# Patient Record
Sex: Male | Born: 1965 | Race: White | Hispanic: No | Marital: Married | Smoking: Never smoker
Health system: Southern US, Community
[De-identification: ages and names within clinical notes are randomized; demographics above are authoritative.]

## PROBLEM LIST (undated history)

## (undated) DIAGNOSIS — K219 Gastro-esophageal reflux disease without esophagitis: Secondary | ICD-10-CM

## (undated) DIAGNOSIS — E119 Type 2 diabetes mellitus without complications: Secondary | ICD-10-CM

## (undated) HISTORY — PX: COLON SURGERY: SHX602

## (undated) HISTORY — PX: EYE SURGERY: SHX253

---

## 2005-02-04 ENCOUNTER — Inpatient Hospital Stay: Payer: Self-pay | Admitting: Surgery

## 2005-02-04 IMAGING — CT CT PELVIS W/O CM
1 series · 16 of 32 positions shown, 20 images · non-contrast
Comparison: none

REASON FOR EXAM: (1) foreign body in abd; rm4; (2) foreign body in abd :rm 4
COMMENTS:

[Series 2: abdomen · axial · 0.74mm/px · z∈[-676,-436]mm · 16 of 54 slices shown, 20 images]
[im 4/54  soft-tissue]
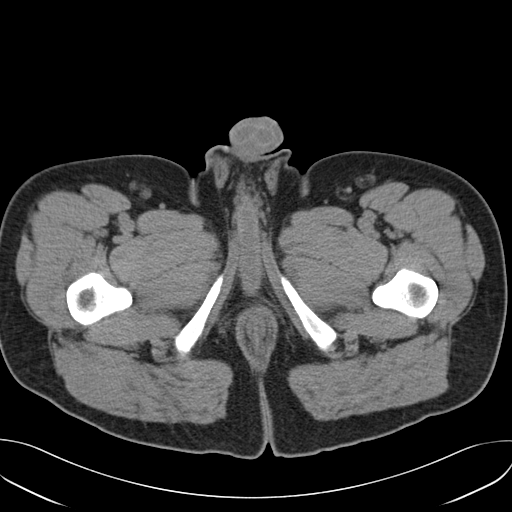
[im 4/54  bone]
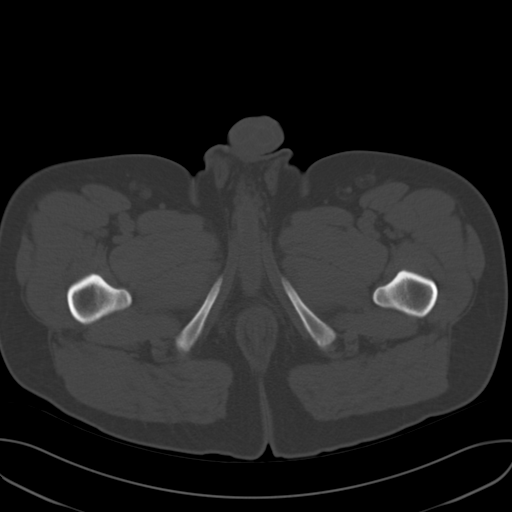
[im 7/54  soft-tissue]
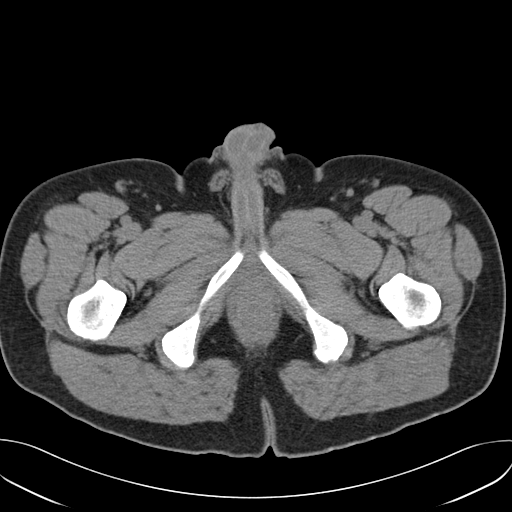
[im 11/54  soft-tissue]
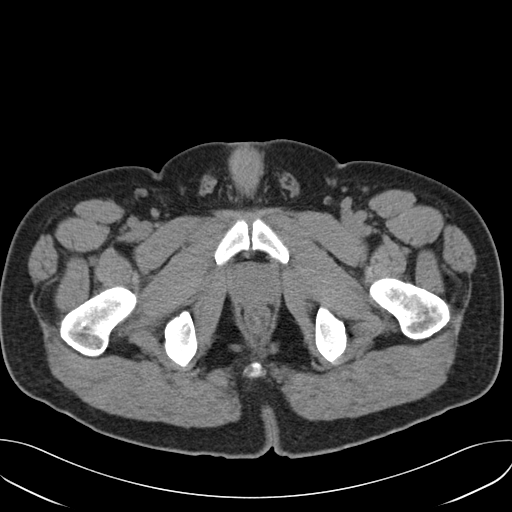
[im 14/54  soft-tissue]
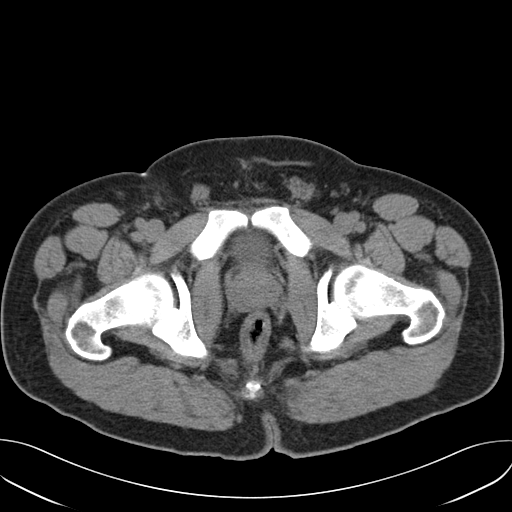
[im 18/54  soft-tissue]
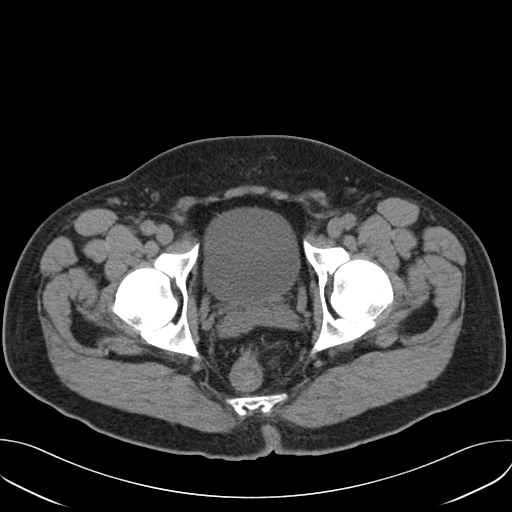
[im 21/54  soft-tissue]
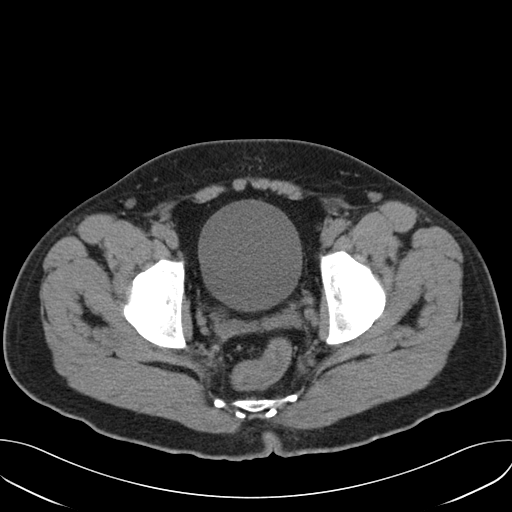
[im 24/54  soft-tissue]
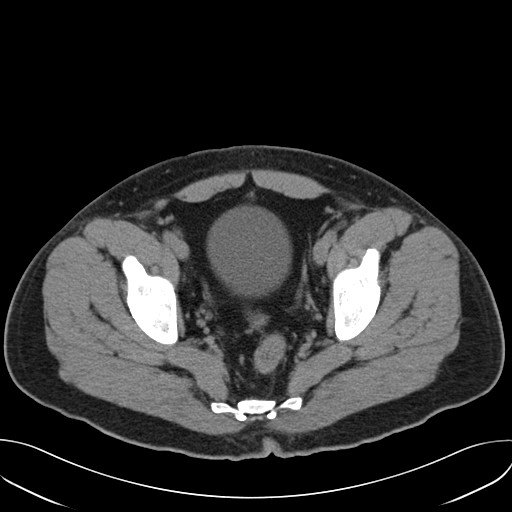
[im 30/54  soft-tissue]
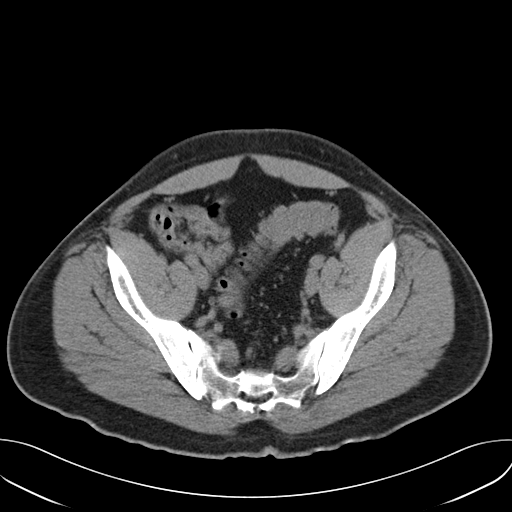
[im 33/54  soft-tissue]
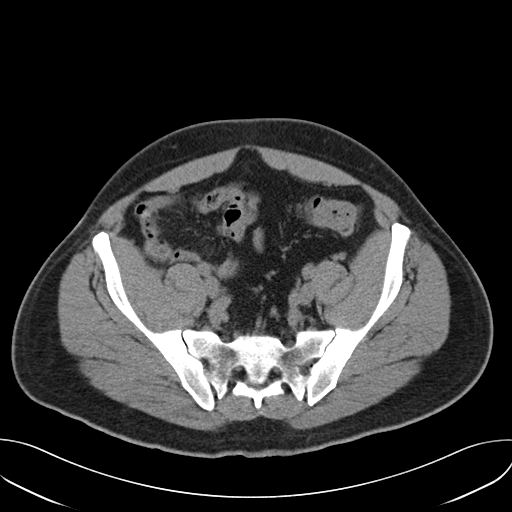
[im 33/54  bone]
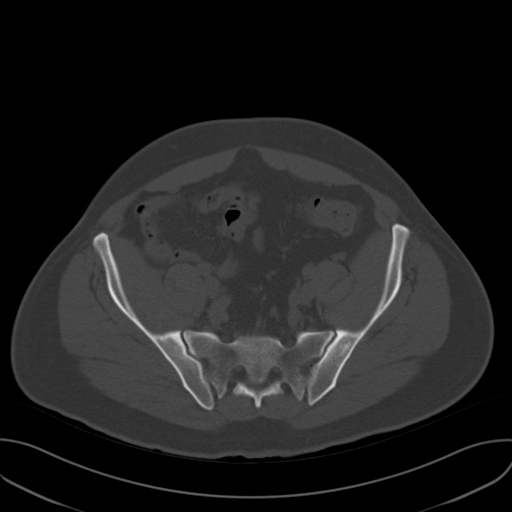
[im 36/54  soft-tissue]
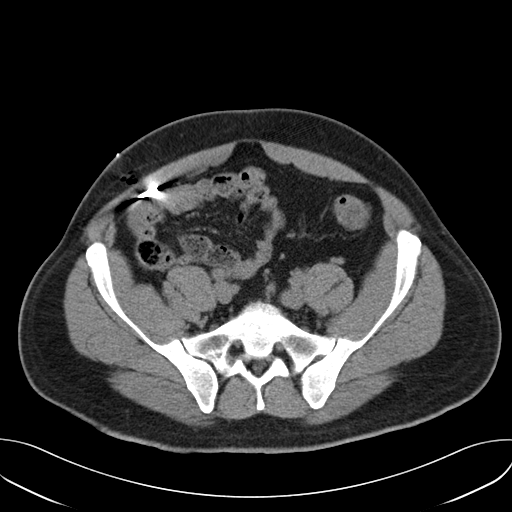
[im 40/54  soft-tissue]
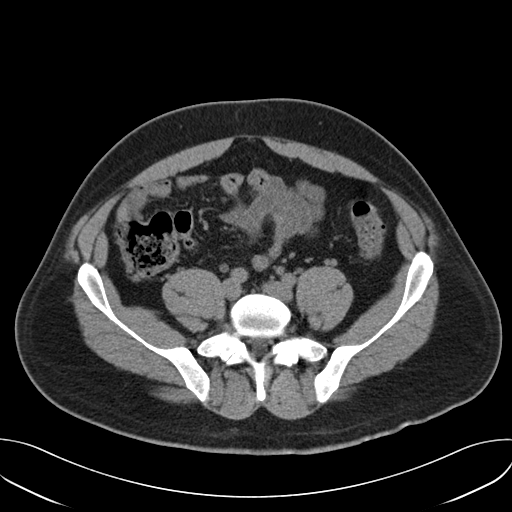
[im 43/54  soft-tissue]
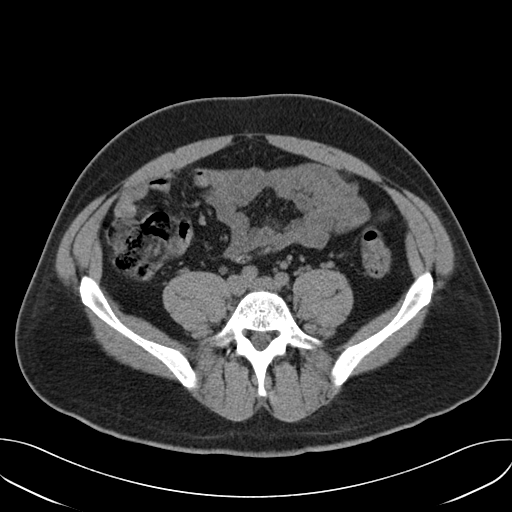
[im 47/54  soft-tissue]
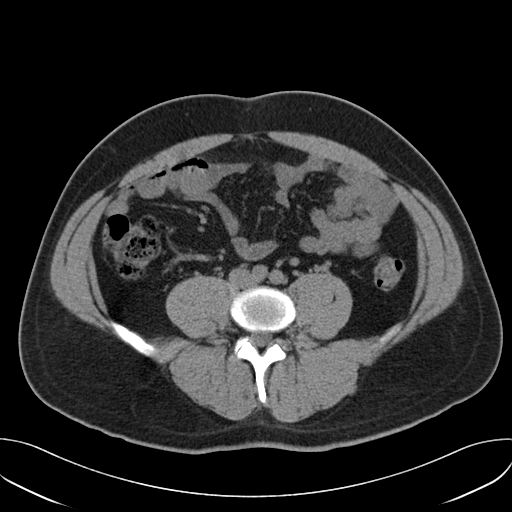
[im 47/54  lung]
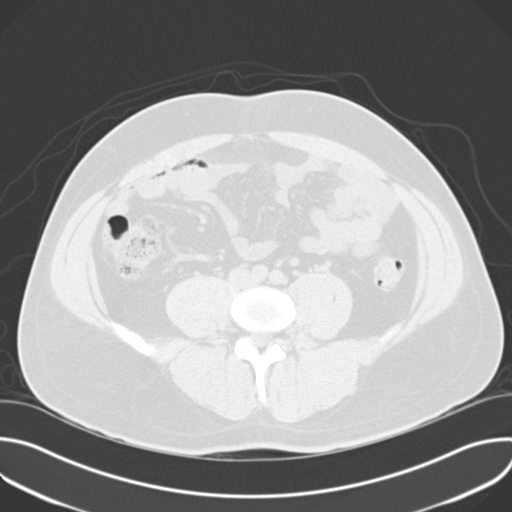
[im 48/54  lung]
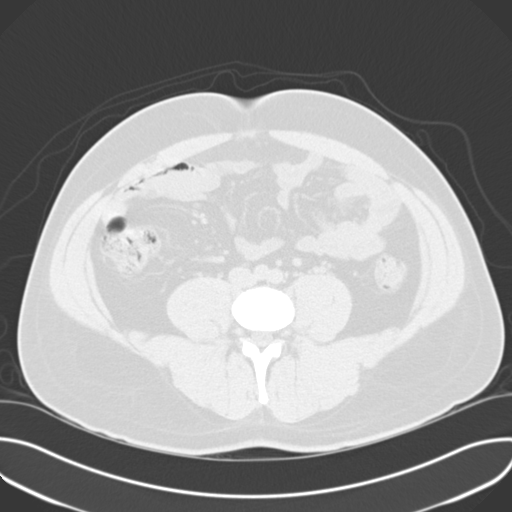
[im 50/54  soft-tissue]
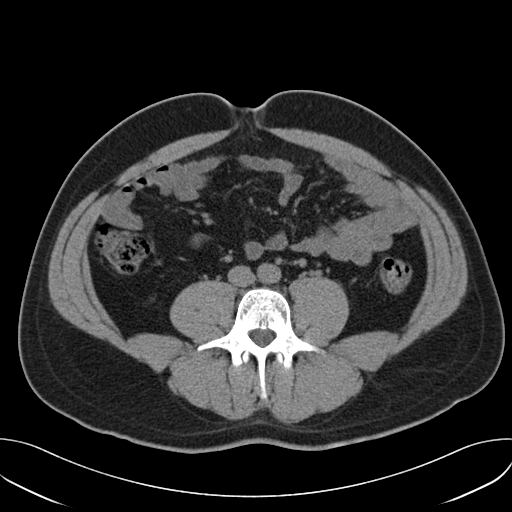
[im 50/54  lung]
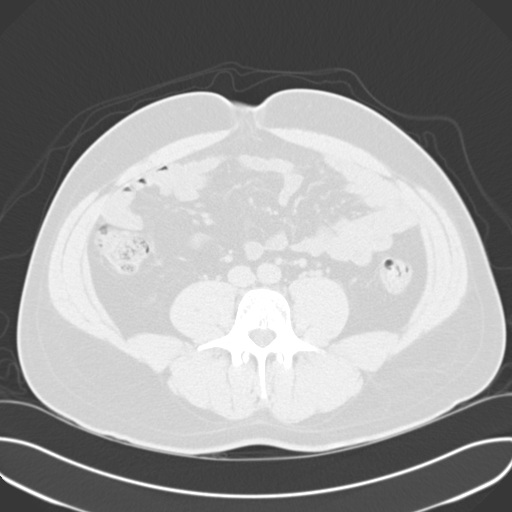
[im 52/54  lung]
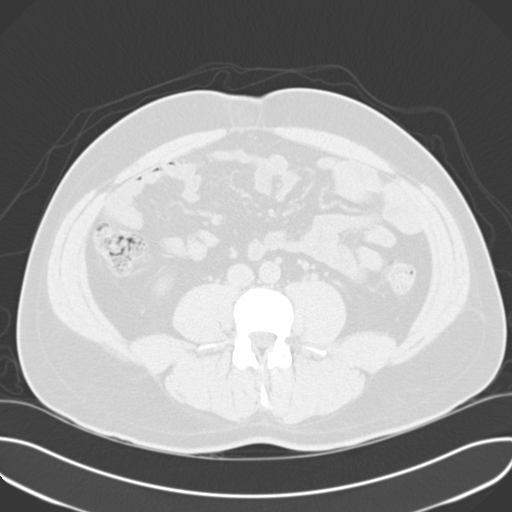

[16 of 32 positions shown; findings below may reference images not displayed]

PROCEDURE:     CT  - CT PELVIS STANDARD WO  - [DATE] [DATE]

RESULT:       Non-gadolinium CT imaging of the pelvis is performed.  The
examination is performed to evaluate for foreign body.  Images demonstrate a
linear density over the anterior RIGHT lower abdominal region superimposed
over the RIGHT iliac crest on the AP topographic view.  This has protruded
through the rectus abdominis muscle and is either adjacent to or extending
into a loop of bowel.    There does not appear to be evidence of free air or
free fluid.  No other significant abnormality is evident.

Findings were phoned to the [HOSPITAL] the time of dictation.
IMPRESSION: Foreign body over the RIGHT anterolateral lower abdominal region.

## 2006-03-13 ENCOUNTER — Ambulatory Visit: Payer: Self-pay | Admitting: Gastroenterology

## 2008-10-04 ENCOUNTER — Emergency Department: Payer: Self-pay | Admitting: Emergency Medicine

## 2008-10-21 ENCOUNTER — Ambulatory Visit: Payer: Self-pay | Admitting: Specialist

## 2008-10-22 ENCOUNTER — Inpatient Hospital Stay: Payer: Self-pay | Admitting: Specialist

## 2008-12-24 ENCOUNTER — Ambulatory Visit: Payer: Self-pay | Admitting: Specialist

## 2009-03-30 ENCOUNTER — Other Ambulatory Visit: Payer: Self-pay | Admitting: Specialist

## 2009-06-04 ENCOUNTER — Ambulatory Visit: Payer: Self-pay | Admitting: Specialist

## 2009-06-10 ENCOUNTER — Ambulatory Visit: Payer: Self-pay | Admitting: Specialist

## 2010-04-06 ENCOUNTER — Observation Stay: Payer: Self-pay | Admitting: Internal Medicine

## 2017-04-24 ENCOUNTER — Encounter: Payer: Self-pay | Admitting: Emergency Medicine

## 2017-04-24 ENCOUNTER — Emergency Department
Admission: EM | Admit: 2017-04-24 | Discharge: 2017-04-24 | Disposition: A | Discharge status: Home / Self Care | Payer: BC Managed Care – PPO | Attending: Emergency Medicine | Admitting: Emergency Medicine

## 2017-04-24 ENCOUNTER — Emergency Department: Payer: BC Managed Care – PPO

## 2017-04-24 ENCOUNTER — Other Ambulatory Visit: Payer: Self-pay

## 2017-04-24 DIAGNOSIS — R079 Chest pain, unspecified: Secondary | ICD-10-CM

## 2017-04-24 DIAGNOSIS — E119 Type 2 diabetes mellitus without complications: Secondary | ICD-10-CM | POA: Insufficient documentation

## 2017-04-24 DIAGNOSIS — R0789 Other chest pain: Secondary | ICD-10-CM | POA: Diagnosis present

## 2017-04-24 HISTORY — DX: Type 2 diabetes mellitus without complications: E11.9

## 2017-04-24 HISTORY — DX: Gastro-esophageal reflux disease without esophagitis: K21.9

## 2017-04-24 LAB — CBC
HEMATOCRIT: 48.1 % (ref 40.0–52.0)
Hemoglobin: 16.1 g/dL (ref 13.0–18.0)
MCH: 26.8 pg (ref 26.0–34.0)
MCHC: 33.5 g/dL (ref 32.0–36.0)
MCV: 79.9 fL — AB (ref 80.0–100.0)
Platelets: 183 10*3/uL (ref 150–440)
RBC: 6.02 MIL/uL — AB (ref 4.40–5.90)
RDW: 14.4 % (ref 11.5–14.5)
WBC: 4.9 10*3/uL (ref 3.8–10.6)

## 2017-04-24 LAB — BASIC METABOLIC PANEL
ANION GAP: 7 (ref 5–15)
BUN: 20 mg/dL (ref 6–20)
CHLORIDE: 102 mmol/L (ref 101–111)
CO2: 26 mmol/L (ref 22–32)
Calcium: 9.3 mg/dL (ref 8.9–10.3)
Creatinine, Ser: 1.16 mg/dL (ref 0.61–1.24)
Glucose, Bld: 152 mg/dL — ABNORMAL HIGH (ref 65–99)
POTASSIUM: 5 mmol/L (ref 3.5–5.1)
SODIUM: 135 mmol/L (ref 135–145)

## 2017-04-24 LAB — TROPONIN I: Troponin I: <0.03 ng/mL (ref <0.03)

## 2017-04-24 MED ORDER — ASPIRIN EC 325 MG PO TBEC
325.0000 mg | DELAYED_RELEASE_TABLET | Freq: Once | ORAL | Status: AC
  Administered 2017-04-24: 325 mg via ORAL
  Filled 2017-04-24 (×3): qty 1

## 2017-04-24 NOTE — ED Notes (Signed)
Patient transported to X-ray 

## 2017-04-24 NOTE — ED Triage Notes (Signed)
Pt reports central CP that radiates to both arms, both sides neck and back for two days. Pt reports associated SOB and nausea. Pt ambulatory to triage. No apparent distress noted.

## 2017-04-24 NOTE — ED Provider Notes (Signed)
[facility] Emergency Department Provider Note       Time seen: ----------------------------------------- 8:32 AM on 04/24/2017 -----------------------------------------   I have reviewed the triage vital signs and the nursing notes.  HISTORY   Chief Complaint Chest Pain    HPI Christian PeterRalph R Gade Jr. is a 51 y.o. male with a history of diabetes and GERD who presents to the ED for centralized chest pain that radiates into both arms, and his neck and back for 2 days. Patient states his been intermittent, on Saturday seemed to get better with Alka-Seltzer and some yesterday as well but today was the worst the pain has been. He has never had pain like this before. He does take omeprazole for GERD. He denies other symptoms or other complaints.  Past Medical History:  Diagnosis Date  . Diabetes mellitus without complication (HCC)   . GERD (gastroesophageal reflux disease)     There are no active problems to display for this patient.  Patient has history of GERD and diabetes   Allergies Patient has no known allergies.  Social History Social History   Tobacco Use  . Smoking status: Not on file  Substance Use Topics  . Alcohol use: Not on file  . Drug use: Not on file    Review of Systems Constitutional: Negative for fever. Eyes: Negative for vision changes ENT:  Negative for congestion, sore throat Cardiovascular: Positive for chest pain Respiratory: Negative for shortness of breath. Gastrointestinal: Negative for abdominal pain, vomiting and diarrhea. Musculoskeletal: Positive for back and arm pain associated with chest pain Skin: Negative for rash. Neurological: Negative for headaches, focal weakness or numbness.  All systems negative/normal/unremarkable except as stated in the HPI  ____________________________________________   PHYSICAL EXAM:  VITAL SIGNS: ED Triage Vitals  Enc Vitals Group     BP 04/24/17 0816 (!) 142/92     Pulse  Rate 04/24/17 0816 75     Resp 04/24/17 0816 18     Temp 04/24/17 0816 97.6 F (36.4 C)     Temp Source 04/24/17 0816 Oral     SpO2 04/24/17 0816 98 %     Weight 04/24/17 0817 240 lb (108.9 kg)     Height 04/24/17 0817 6' (1.829 m)     Head Circumference --      Peak Flow --      Pain Score 04/24/17 0816 2     Pain Loc --      Pain Edu? --      Excl. in GC? --     Constitutional: Alert and oriented. Well appearing and in no distress. Eyes: Conjunctivae are normal. Normal extraocular movements. ENT   Head: Normocephalic and atraumatic.   Nose: No congestion/rhinnorhea.   Mouth/Throat: Mucous membranes are moist.   Neck: No stridor. Cardiovascular: Normal rate, regular rhythm. No murmurs, rubs, or gallops. Respiratory: Normal respiratory effort without tachypnea nor retractions. Breath sounds are clear and equal bilaterally. No wheezes/rales/rhonchi. Gastrointestinal: Soft and nontender. Normal bowel sounds Musculoskeletal: Nontender with normal range of motion in extremities. No lower extremity tenderness nor edema. Neurologic:  Normal speech and language. No gross focal neurologic deficits are appreciated.  Skin:  Skin is warm, dry and intact. No rash noted. Psychiatric: Mood and affect are normal. Speech and behavior are normal.  ____________________________________________  EKG: Interpreted by me. Sinus rhythm rate 81 bpm, normal PR interval, normal QRS, normal QT.  ____________________________________________  ED COURSE:  Pertinent labs & imaging results that were available during my care  of the patient were reviewed by me and considered in my medical decision making (see chart for details). Patient presents for chest pain, we will assess with labs and imaging as indicated.   Procedures ____________________________________________   LABS (pertinent positives/negatives)  Labs Reviewed  BASIC METABOLIC PANEL - Abnormal; Notable for the following components:       Result Value   Glucose, Bld 152 (*)    All other components within normal limits  CBC - Abnormal; Notable for the following components:   RBC 6.02 (*)    MCV 79.9 (*)    All other components within normal limits  TROPONIN I  TROPONIN I    RADIOLOGY Images were viewed by me  Chest x-ray IMPRESSION: No active cardiopulmonary disease. ____________________________________________  DIFFERENTIAL DIAGNOSIS   Unstable angina, PE, MI, dissection, GERD, musculoskeletal pain   FINAL ASSESSMENT AND PLAN  Chest pain   Plan: Patient had presented for chest pain. Patient's labs were reassuring including repeat troponin. Patient's imaging is negative. He'll be referred to cardiology for outpatient follow-up.   Emily FilbertWilliams, Luz Mares E, MD   Note: This note was generated in part or whole with voice recognition software. Voice recognition is usually quite accurate but there are transcription errors that can and very often do occur. I apologize for any typographical errors that were not detected and corrected.     Emily FilbertWilliams, Iriana Artley E, MD 04/24/17 [phone]

## 2017-04-24 NOTE — ED Notes (Signed)
Pharmacy notified to send EC Aspirin.

## 2017-04-24 NOTE — ED Notes (Signed)
First Nurse: pt ambulatory to stat desk with c/o chest pain all weekend and same today, just wants to get checked out. NAD.

## 2017-04-26 ENCOUNTER — Telehealth: Payer: Self-pay

## 2017-04-26 NOTE — Telephone Encounter (Signed)
Lmov for patient to call and schedule ED fu They were seen on 04/24/17 for CP   Will be a new patient to office   Will try again at a later time

## 2017-05-04 NOTE — Telephone Encounter (Signed)
Called patient, he states he will not need our services

## 2019-01-22 ENCOUNTER — Other Ambulatory Visit: Payer: Self-pay

## 2019-01-22 DIAGNOSIS — Z20822 Contact with and (suspected) exposure to covid-19: Secondary | ICD-10-CM

## 2019-01-24 LAB — NOVEL CORONAVIRUS, NAA: SARS-CoV-2, NAA: NOT DETECTED

## 2019-06-20 ENCOUNTER — Inpatient Hospital Stay
Admission: EM | Admit: 2019-06-20 | Discharge: 2019-06-22 | DRG: 121 | Disposition: A | Discharge status: Other Acute Inpatient Hospital | Payer: BC Managed Care – PPO | Source: Ambulatory Visit | Attending: Internal Medicine | Admitting: Internal Medicine

## 2019-06-20 ENCOUNTER — Emergency Department: Payer: BC Managed Care – PPO

## 2019-06-20 ENCOUNTER — Other Ambulatory Visit: Payer: Self-pay

## 2019-06-20 DIAGNOSIS — L03213 Periorbital cellulitis: Secondary | ICD-10-CM | POA: Diagnosis present

## 2019-06-20 DIAGNOSIS — Z79899 Other long term (current) drug therapy: Secondary | ICD-10-CM

## 2019-06-20 DIAGNOSIS — Z8781 Personal history of (healed) traumatic fracture: Secondary | ICD-10-CM | POA: Diagnosis not present

## 2019-06-20 DIAGNOSIS — H0520 Unspecified exophthalmos: Secondary | ICD-10-CM | POA: Diagnosis present

## 2019-06-20 DIAGNOSIS — F329 Major depressive disorder, single episode, unspecified: Secondary | ICD-10-CM | POA: Diagnosis present

## 2019-06-20 DIAGNOSIS — H05011 Cellulitis of right orbit: Principal | ICD-10-CM | POA: Diagnosis present

## 2019-06-20 DIAGNOSIS — E119 Type 2 diabetes mellitus without complications: Secondary | ICD-10-CM | POA: Diagnosis present

## 2019-06-20 DIAGNOSIS — Z7984 Long term (current) use of oral hypoglycemic drugs: Secondary | ICD-10-CM

## 2019-06-20 DIAGNOSIS — E785 Hyperlipidemia, unspecified: Secondary | ICD-10-CM | POA: Diagnosis present

## 2019-06-20 DIAGNOSIS — H05019 Cellulitis of unspecified orbit: Secondary | ICD-10-CM | POA: Diagnosis present

## 2019-06-20 DIAGNOSIS — K219 Gastro-esophageal reflux disease without esophagitis: Secondary | ICD-10-CM | POA: Diagnosis present

## 2019-06-20 DIAGNOSIS — J320 Chronic maxillary sinusitis: Secondary | ICD-10-CM | POA: Diagnosis present

## 2019-06-20 DIAGNOSIS — J322 Chronic ethmoidal sinusitis: Secondary | ICD-10-CM | POA: Diagnosis present

## 2019-06-20 DIAGNOSIS — F419 Anxiety disorder, unspecified: Secondary | ICD-10-CM

## 2019-06-20 DIAGNOSIS — Z20822 Contact with and (suspected) exposure to covid-19: Secondary | ICD-10-CM | POA: Diagnosis present

## 2019-06-20 LAB — BASIC METABOLIC PANEL
Anion gap: 10 (ref 5–15)
BUN: 20 mg/dL (ref 6–20)
CO2: 28 mmol/L (ref 22–32)
Calcium: 9.3 mg/dL (ref 8.9–10.3)
Chloride: 100 mmol/L (ref 98–111)
Creatinine, Ser: 0.91 mg/dL (ref 0.61–1.24)
GFR calc Af Amer: >60 mL/min (ref >60)
GFR calc non Af Amer: >60 mL/min (ref >60)
Glucose, Bld: 156 mg/dL — ABNORMAL HIGH (ref 70–99)
Potassium: 3.7 mmol/L (ref 3.5–5.1)
Sodium: 138 mmol/L (ref 135–145)

## 2019-06-20 LAB — CBC WITH DIFFERENTIAL/PLATELET
Abs Immature Granulocytes: 0.04 10*3/uL (ref 0.00–0.07)
Basophils Absolute: 0 10*3/uL (ref 0.0–0.1)
Basophils Relative: 1 %
Eosinophils Absolute: 0.2 10*3/uL (ref 0.0–0.5)
Eosinophils Relative: 2 %
HCT: 48.1 % (ref 39.0–52.0)
Hemoglobin: 15.8 g/dL (ref 13.0–17.0)
Immature Granulocytes: 1 %
Lymphocytes Relative: 15 %
Lymphs Abs: 1.3 10*3/uL (ref 0.7–4.0)
MCH: 26.7 pg (ref 26.0–34.0)
MCHC: 32.8 g/dL (ref 30.0–36.0)
MCV: 81.3 fL (ref 80.0–100.0)
Monocytes Absolute: 0.9 10*3/uL (ref 0.1–1.0)
Monocytes Relative: 10 %
Neutro Abs: 6.5 10*3/uL (ref 1.7–7.7)
Neutrophils Relative %: 71 %
Platelets: 229 10*3/uL (ref 150–400)
RBC: 5.92 MIL/uL — ABNORMAL HIGH (ref 4.22–5.81)
RDW: 13 % (ref 11.5–15.5)
WBC: 8.9 10*3/uL (ref 4.0–10.5)
nRBC: 0 % (ref 0.0–0.2)

## 2019-06-20 MED ORDER — IOHEXOL 300 MG/ML  SOLN
75.0000 mL | Freq: Once | INTRAMUSCULAR | Status: AC | PRN
  Administered 2019-06-20: 22:00:00 75 mL via INTRAVENOUS

## 2019-06-20 MED ORDER — TRAZODONE HCL 50 MG PO TABS: 25.0000 mg | ORAL_TABLET | Freq: Every evening | ORAL | Status: DC | PRN

## 2019-06-20 MED ORDER — VANCOMYCIN HCL 750 MG/150ML IV SOLN
750.0000 mg | Freq: Once | INTRAVENOUS | Status: AC
  Administered 2019-06-21: 750 mg via INTRAVENOUS
  Filled 2019-06-20: qty 150

## 2019-06-20 MED ORDER — ACETAMINOPHEN 325 MG PO TABS: 650.0000 mg | ORAL_TABLET | Freq: Four times a day (QID) | ORAL | Status: DC | PRN

## 2019-06-20 MED ORDER — SODIUM CHLORIDE 0.9 % IV SOLN: INTRAVENOUS | Status: DC

## 2019-06-20 MED ORDER — METRONIDAZOLE IN NACL 5-0.79 MG/ML-% IV SOLN: 500.0000 mg | Freq: Once | INTRAVENOUS | Status: DC

## 2019-06-20 MED ORDER — ONDANSETRON HCL 4 MG/2ML IJ SOLN
INTRAMUSCULAR | Status: AC
  Filled 2019-06-20: qty 2

## 2019-06-20 MED ORDER — PANTOPRAZOLE SODIUM 40 MG PO TBEC
40.0000 mg | DELAYED_RELEASE_TABLET | Freq: Every day | ORAL | Status: DC
  Administered 2019-06-21 – 2019-06-22 (×2): 40 mg via ORAL
  Filled 2019-06-20 (×2): qty 1

## 2019-06-20 MED ORDER — VANCOMYCIN HCL 1500 MG/300ML IV SOLN
1500.0000 mg | Freq: Once | INTRAVENOUS | Status: AC
  Administered 2019-06-20: 22:00:00 1500 mg via INTRAVENOUS
  Filled 2019-06-20: qty 300

## 2019-06-20 MED ORDER — HYDROMORPHONE HCL 1 MG/ML IJ SOLN
INTRAMUSCULAR | Status: AC
  Administered 2019-06-20: 1 mg
  Filled 2019-06-20: qty 1

## 2019-06-20 MED ORDER — PIPERACILLIN-TAZOBACTAM 3.375 G IVPB 30 MIN: 3.3750 g | Freq: Once | INTRAVENOUS | Status: DC

## 2019-06-20 MED ORDER — BUPROPION HCL ER (SR) 150 MG PO TB12: 300.0000 mg | ORAL_TABLET | Freq: Every day | ORAL | Status: DC

## 2019-06-20 MED ORDER — PIPERACILLIN-TAZOBACTAM 3.375 G IVPB
3.3750 g | Freq: Three times a day (TID) | INTRAVENOUS | Status: DC
  Administered 2019-06-21 – 2019-06-22 (×5): 3.375 g via INTRAVENOUS
  Filled 2019-06-20 (×5): qty 50

## 2019-06-20 MED ORDER — SODIUM CHLORIDE 0.9 % IV SOLN: 2.0000 g | Freq: Once | INTRAVENOUS | Status: DC

## 2019-06-20 MED ORDER — ONDANSETRON HCL 4 MG PO TABS: 4.0000 mg | ORAL_TABLET | Freq: Four times a day (QID) | ORAL | Status: DC | PRN

## 2019-06-20 MED ORDER — TESTOSTERONE CYPIONATE 200 MG/ML IM SOLN: 200.0000 mg | INTRAMUSCULAR | Status: DC

## 2019-06-20 MED ORDER — ONDANSETRON HCL 4 MG/2ML IJ SOLN
4.0000 mg | Freq: Once | INTRAMUSCULAR | Status: AC
  Administered 2019-06-20: 22:00:00 4 mg via INTRAVENOUS

## 2019-06-20 MED ORDER — ENOXAPARIN SODIUM 40 MG/0.4ML ~~LOC~~ SOLN
40.0000 mg | SUBCUTANEOUS | Status: DC
  Administered 2019-06-21 (×2): 40 mg via SUBCUTANEOUS
  Filled 2019-06-20 (×2): qty 0.4

## 2019-06-20 MED ORDER — MAGNESIUM HYDROXIDE 400 MG/5ML PO SUSP: 30.0000 mL | Freq: Every day | ORAL | Status: DC | PRN

## 2019-06-20 MED ORDER — HYDROMORPHONE HCL 1 MG/ML IJ SOLN: 1.0000 mg | Freq: Once | INTRAMUSCULAR | Status: AC

## 2019-06-20 MED ORDER — ACETAMINOPHEN 650 MG RE SUPP: 650.0000 mg | Freq: Four times a day (QID) | RECTAL | Status: DC | PRN

## 2019-06-20 MED ORDER — ONDANSETRON HCL 4 MG/2ML IJ SOLN
4.0000 mg | Freq: Four times a day (QID) | INTRAMUSCULAR | Status: DC | PRN
  Administered 2019-06-21: 4 mg via INTRAVENOUS
  Filled 2019-06-20: qty 2

## 2019-06-20 MED ORDER — VANCOMYCIN HCL IN DEXTROSE 1-5 GM/200ML-% IV SOLN: 1000.0000 mg | Freq: Once | INTRAVENOUS | Status: DC

## 2019-06-20 MED ORDER — HYDROMORPHONE HCL 1 MG/ML IJ SOLN
INTRAMUSCULAR | Status: AC
  Administered 2019-06-20: 23:00:00 1 mg via INTRAVENOUS
  Filled 2019-06-20: qty 1

## 2019-06-20 NOTE — ED Triage Notes (Signed)
Pt to the er for cellulitis of the right eye. Pt has been tx with antibiotics by opthalmologist and was sent here for ct scan and possible admission. 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 Pt has yellow drainage from the right eye and gross swelling.

## 2019-06-20 NOTE — ED Notes (Signed)
Pt transported to CT in NAD.

## 2019-06-20 NOTE — ED Notes (Signed)
Pt

## 2019-06-20 NOTE — H&P (Signed)
[place] at [facility]   PATIENT NAME: Christian Burke    MR#:  [id number]  DATE OF BIRTH:  28-Jul-1965  DATE OF ADMISSION:  06/20/2019  PRIMARY CARE PHYSICIAN: Dorothey Baseman, MD   REQUESTING/REFERRING PHYSICIAN: Alfonse Flavors, MD  CHIEF COMPLAINT:   Chief Complaint  Patient presents with  . Facial Swelling    HISTORY OF PRESENT ILLNESS:  Christian Burke  is a 54 y.o. male with a known history of type 2 diabetes mellitus and GERD, as well as old facial injury after which she had previous bouts of periorbital cellulitis, who presented to the emergency room with acute onset of worsening periorbital swelling with erythema and tenderness and pain since Sunday.  He was exposed to somebody smoking a couple of days earlier and started developing nasal and sinus congestion with right-sided headache.  He then noticed infraorbital swelling for which he was seen by his PCP and was given antibiotics treatment and prednisone.  He was referred by his PCP to an optometrist/ophthalmologist with a dilated eye exam and sounded normal then referred him to ENT and after being evaluated he was referred here.  He admitted to occasional nausea without vomiting.  No cough or wheezing or dyspnea or recent COVID-19 exposure.  No dysuria, oliguria or hematuria or flank pain.  Upon presentation to the emergency room, temperature was 97.3 with a blood pressure 136/92 with otherwise normal vital signs.  Labs revealed no significant abnormalities.  His COVID-19 PCR is currently pending.  Maxillofacial CT showed infraorbital cellulitis and/or phlegmon with secondary mass-effect on the ideations right inferior rectus muscle and right globe with right sided proptosis.  No frank intraorbital or retrobulbar abscess.  There were inflammatory changes extending to the right maxillary sinus through air chronic right orbital floor fracture defect.  It showed growth of mold fractures involving the right lamina  papyracea and right orbital floor as well as right zygomatic arch and right maxillary sinus.  Also showed right chronic paranasal sinus disease.  The patient was given 4 mg of IV Zofran IV Zosyn and IV vancomycin.  He will be admitted to a medical bed for further evaluation and management. PAST MEDICAL HISTORY:   Past Medical History:  Diagnosis Date  . Diabetes mellitus without complication (HCC)   . GERD (gastroesophageal reflux disease)     PAST SURGICAL HISTORY:   Past Surgical History:  Procedure Laterality Date  . EYE SURGERY      SOCIAL HISTORY:   Social History   Tobacco Use  . Smoking status: Never Smoker  . Smokeless tobacco: Never Used  Substance Use Topics  . Alcohol use: Yes    Comment: occassion    FAMILY HISTORY:  No family history on file.  No reported familial diseases.  DRUG ALLERGIES:  No Known Allergies  REVIEW OF SYSTEMS:   ROS As per history of present illness. All pertinent systems were reviewed above. Constitutional,  HEENT, cardiovascular, respiratory, GI, GU, musculoskeletal, neuro, psychiatric, endocrine,  integumentary and hematologic systems were reviewed and are otherwise  negative/unremarkable except for positive findings mentioned above in the HPI.   MEDICATIONS AT HOME:   Prior to Admission medications   Medication Sig Start Date End Date Taking? Authorizing Provider  ALPRAZolam (XANAX) 0.25 MG tablet Take 0.25 mg by mouth daily as needed. 03/25/19  Yes [provider]  atorvastatin (LIPITOR) 20 MG tablet Take 20 mg by mouth daily. 03/25/19  Yes [provider]  fluticasone (FLONASE) 50 MCG/ACT nasal spray  Place 2 sprays into both nostrils daily.   Yes [provider]  glimepiride (AMARYL) 4 MG tablet Take 4 mg by mouth every morning. 03/25/19  Yes [provider]  loratadine-pseudoephedrine (CLARITIN-D 24-HOUR) 10-240 MG 24 hr tablet Take 1 tablet by mouth daily.   Yes [provider]    metFORMIN (GLUCOPHAGE-XR) 500 MG 24 hr tablet Take 1,000 mg by mouth daily with breakfast.    Yes [provider]  omeprazole (PRILOSEC) 20 MG capsule Take 20 mg by mouth 2 (two) times daily before a meal.    Yes [provider]  predniSONE (DELTASONE) 10 MG tablet Take 10 mg by mouth daily. 06/17/19  Yes [provider]  testosterone cypionate (DEPOTESTOSTERONE CYPIONATE) 200 MG/ML injection Inject 1 mL into the muscle every 14 (fourteen) days.   Yes [provider]  amoxicillin (AMOXIL) 875 MG tablet Take 875 mg by mouth 2 (two) times daily. 06/17/19   [provider]  doxycycline (VIBRA-TABS) 100 MG tablet Take 100 mg by mouth 2 (two) times daily. 06/20/19   [provider]      VITAL SIGNS:  Blood pressure (!) 150/86, pulse 92, temperature (!) 97.3 F (36.3 C), temperature source Oral, resp. rate 20, height 6' (1.829 m), weight 111.1 kg, SpO2 97 %.  PHYSICAL EXAMINATION:  Physical Exam  GENERAL:  54 y.o.-year-old Caucasian male patient lying in the bed with no acute distress.  EYES: Pupils equal, round, reactive to light and accommodation. No scleral icterus. Extraocular muscles intact.  HEENT: Head atraumatic, normocephalic. Oropharynx and nasopharynx clear.  NECK:  Supple, no jugular venous distention. No thyroid enlargement, no tenderness.  LUNGS: Normal breath sounds bilaterally, no wheezing, rales,rhonchi or crepitation. No use of accessory muscles of respiration.  CARDIOVASCULAR: Regular rate and rhythm, S1, S2 normal. No murmurs, rubs, or gallops.  ABDOMEN: Soft, nondistended, nontender. Bowel sounds present. No organomegaly or mass.  EXTREMITIES: No pedal edema, cyanosis, or clubbing.  NEUROLOGIC: Cranial nerves II through XII are intact. Muscle strength 5/5 in all extremities. Sensation intact. Gait not checked.  PSYCHIATRIC: The patient is alert and oriented x 3.  Normal affect and good eye contact. SKIN: Right infraorbital  swelling with erythema, induration and tenderness extending to his nose and causing pressure effect with almost shut down right eye.  There is no palpable fluctuation.  There are matted glued eyelashes    LABORATORY PANEL:   CBC Recent Labs  Lab 06/20/19 2054  WBC 8.9  HGB 15.8  HCT 48.1  PLT 229   ------------------------------------------------------------------------------------------------------------------  Chemistries  Recent Labs  Lab 06/20/19 2054  NA 138  K 3.7  CL 100  CO2 28  GLUCOSE 156*  BUN 20  CREATININE 0.91  CALCIUM 9.3   ------------------------------------------------------------------------------------------------------------------  Cardiac Enzymes No results for input(s): TROPONINI in the last 168 hours. ------------------------------------------------------------------------------------------------------------------  RADIOLOGY:  CT Maxillofacial W Contrast  Result Date: 06/20/2019 CLINICAL DATA:  Initial evaluation for acute right eye swelling, redness and drainage. EXAM: CT MAXILLOFACIAL WITH CONTRAST TECHNIQUE: Multidetector CT imaging of the maxillofacial structures was performed with intravenous contrast. Multiplanar CT image reconstructions were also generated. CONTRAST:  104mL OMNIPAQUE IOHEXOL 300 MG/ML  SOLN COMPARISON:  None available. FINDINGS: Osseous: Remote fractures involving the right lamina papyracea, right orbital floor, and right maxillary sinus are seen. Sequelae of prior ORIF with malleable plate screw fixation in place at the anterior right maxillary sinus and superolateral right orbit. Probable remotely healed right zygomatic arch fracture noted as well. No acute  osseous abnormality. No discrete osseous lesions. No significant dental disease identified. Orbits: Abnormal soft tissue density infiltrates the postseptal extraconal fat of the inferior right orbit, concerning for intraorbital cellulitis and/or phlegmon. This extends from the  right maxillary sinus through the chronic right orbital floor fracture defect. Secondary mass effect on the adjacent inferior rectus muscle which is displaced superiorly. Mild mass effect on the adjacent right globe which is mildly flattened at its superior and inferior margins. Right-sided proptosis. Inflammatory process predominantly confined to the extraconal fat, with only minimal stranding seen within the intraconal fat. No extension posteriorly into the orbital apex at this time. No frank intraorbital or retro bulbar abscess. Left globe and orbital soft tissues within normal limits. Sinuses: Chronic opacification of the right maxillary sinus, right ethmoidal air cells, and right frontoethmoidal recess. Mild mucosal thickening within the right sphenoid sinus. Left paranasal sinuses are largely clear. Mastoid air cells and middle ear cavities are well pneumatized and free of fluid. Soft tissues: Associated preseptal soft tissue swelling and induration about the right eye and orbit. No discrete abscess or drainable fluid collection. Remainder of the visualized soft tissues of the face are within normal limits. Limited intracranial: Unremarkable. IMPRESSION: 1. Abnormal soft tissue density involving the postseptal extraconal fat of the inferior right orbit, concerning for intraorbital cellulitis and/or phlegmon. Secondary mass effect on the adjacent right inferior rectus muscle and right globe with right-sided proptosis. No frank intraorbital or retro bulbar abscess. Inflammatory changes extend from the right maxillary sinus through a chronic right orbital floor fracture defect. 2. Remote fractures involving the right lamina papyracea, right orbital floor, right zygomatic arch, and right maxillary sinus. 3. Chronic right-sided paranasal sinus disease as above. Critical Value/emergent results were called by telephone at the time of interpretation on 06/20/2019 at 10:08 pm to provider Dr. Scotty Court, Who verbally  acknowledged these results. Electronically Signed   By: Rise Mu M.D.   On: 06/20/2019 22:12      IMPRESSION AND PLAN:   1.  Right periorbital/infraorbital severe likely nonpurulent cellulitis.  The patient will be admitted to a medical bed.  We will continue antibiotic therapy with IV vancomycin and Zosyn.  Dr. Brooke Dare with ophthalmology was notified about the patient and will be consulted.  He agreed with above management.  Warm compresses will be applied.  2.  Type 2 diabetes mellitus.  The patient will be placed on supplement coverage with NovoLog.  Metformin will be held off.  3.  Anxiety and depression.  We will continue Wellbutrin SR and Xanax.  4.  Dyslipidemia.  We will continue statin therapy.  5.  GERD.  We will resume PPI therapy.  6.  DVT prophylaxis.  Subcutaneous Lovenox     All the records are reviewed and case discussed with ED provider. The plan of care was discussed in details with the patient (and family). I answered all questions. The patient agreed to proceed with the above mentioned plan. Further management will depend upon hospital course.   CODE STATUS: Full code  TOTAL TIME TAKING CARE OF THIS PATIENT: 55 minutes.    Hannah Beat M.D on 06/20/2019 at 11:31 PM  [facility]   From 7 PM-7 AM, contact night-coverage [email or web address]  CC: Primary care physician; Dorothey Baseman, MD   Note: This dictation was prepared with Dragon dictation along with smaller phrase technology. Any transcriptional errors that result from this process are unintentional.

## 2019-06-20 NOTE — ED Notes (Signed)
Report called to Jeannett Senior RN pt admitted to 207 via stretcher.

## 2019-06-20 NOTE — ED Notes (Deleted)
Pt to CT

## 2019-06-20 NOTE — ED Provider Notes (Signed)
[facility] Emergency Department Provider Note  ____________________________________________  Time seen: Approximately 10:52 PM  I have reviewed the triage vital signs and the nursing notes.   HISTORY  Chief Complaint Facial Swelling    HPI Christian Burke. is a 54 y.o. male with a history of diabetes and GERD and prior right-sided orbital fracture from 1994 who comes the ED complaining of right eye pain swelling and discharge, gradual onset and worsening for the past 5 days, constant, no aggravating or alleviating factors.  Was given some oral antibiotics by primary care without improvement.  Saw ENT and ophthalmology who sent the patient to the ED for CT scan.  Denies change in vision.  Has a mild frontal headache, no confusion or loss of balance or coordination.  Denies fever.      Past Medical History:  Diagnosis Date  . Diabetes mellitus without complication ([place])   . GERD (gastroesophageal reflux disease)      Patient Active Problem List   Diagnosis Date Noted  . Orbital cellulitis 06/20/2019     Past Surgical History:  Procedure Laterality Date  . EYE SURGERY       Prior to Admission medications   Medication Sig Start Date End Date Taking? Authorizing Provider  buPROPion (WELLBUTRIN SR) 150 MG 12 hr tablet Take 300 mg by mouth daily.    [provider]  metFORMIN (GLUCOPHAGE-XR) 500 MG 24 hr tablet Take 1,000 mg by mouth daily with supper.    [provider]  omeprazole (PRILOSEC) 20 MG capsule Take 20 mg by mouth daily.    [provider]  testosterone cypionate (DEPOTESTOSTERONE CYPIONATE) 200 MG/ML injection Inject 1 mL into the muscle every 14 (fourteen) days.    [provider]     Allergies Patient has no known allergies.   No family history on file.  Social History Social History   Tobacco Use  . Smoking status: Never Smoker  . Smokeless tobacco: Never Used  Substance Use Topics   . Alcohol use: Yes    Comment: occassion  . Drug use: Never    Review of Systems  Constitutional:   No fever or chills.  ENT:   No sore throat. No rhinorrhea.  Right eye swelling as above Cardiovascular:   No chest pain or syncope. Respiratory:   No dyspnea or cough. Gastrointestinal:   Negative for abdominal pain, vomiting and diarrhea.  Musculoskeletal:   Negative for focal pain or swelling All other systems reviewed and are negative except as documented above in ROS and HPI.  ____________________________________________   PHYSICAL EXAM:  VITAL SIGNS: ED Triage Vitals  Enc Vitals Group     BP 06/20/19 1808 (!) 156/92     Pulse Rate 06/20/19 1808 98     Resp 06/20/19 1808 18     Temp 06/20/19 1808 (!) 97.3 F (36.3 C)     Temp Source 06/20/19 1808 Oral     SpO2 06/20/19 1808 99 %     Weight 06/20/19 1811 245 lb (111.1 kg)     Height 06/20/19 1811 6' (1.829 m)     Head Circumference --      Peak Flow --      Pain Score 06/20/19 2058 8     Pain Loc --      Pain Edu? --      Excl. in [place]? --     Vital signs reviewed, nursing assessments reviewed.   Constitutional:   Alert and oriented.  Non-toxic appearance. Eyes:   Pronounced infraorbital swelling on the right, firm in the area of the lacrimal sac with tenderness and erythema.  Thick discharge in the right eye surface.  Pronounced swelling of the conjunctiva.EOMI and painless. PERRL, no APD. ENT      Head:   Normocephalic and atraumatic.      Nose:   Wearing a mask.      Mouth/Throat:   Wearing a mask.      Neck:   No meningismus. Full ROM. Hematological/Lymphatic/Immunilogical:   No cervical lymphadenopathy. Cardiovascular:   RRR. Symmetric bilateral radial and DP pulses.  No murmurs. Cap refill less than 2 seconds. Respiratory:   Normal respiratory effort without tachypnea/retractions. Breath sounds are clear and equal bilaterally. No wheezes/rales/rhonchi. Musculoskeletal:   Normal range of motion in all  extremities. No joint effusions.  No lower extremity tenderness.  No edema. Neurologic:   Normal speech and language.  Motor grossly intact. No acute focal neurologic deficits are appreciated.  Skin:    Skin is warm, dry and intact. No rash noted.  No petechiae, purpura, or bullae.  ____________________________________________    LABS (pertinent positives/negatives) (all labs ordered are listed, but only abnormal results are displayed) Labs Reviewed  BASIC METABOLIC PANEL - Abnormal; Notable for the following components:      Result Value   Glucose, Bld 156 (*)    All other components within normal limits  CBC WITH DIFFERENTIAL/PLATELET - Abnormal; Notable for the following components:   RBC 5.92 (*)    All other components within normal limits  SARS CORONAVIRUS 2 (TAT 6-24 HRS)  HIV ANTIBODY (ROUTINE TESTING W REFLEX)  CREATININE, SERUM  BASIC METABOLIC PANEL  CBC   ____________________________________________   EKG    ____________________________________________    RADIOLOGY  CT Maxillofacial W Contrast  Result Date: 06/20/2019 CLINICAL DATA:  Initial evaluation for acute right eye swelling, redness and drainage. EXAM: CT MAXILLOFACIAL WITH CONTRAST TECHNIQUE: Multidetector CT imaging of the maxillofacial structures was performed with intravenous contrast. Multiplanar CT image reconstructions were also generated. CONTRAST:  29mL OMNIPAQUE IOHEXOL 300 MG/ML  SOLN COMPARISON:  None available. FINDINGS: Osseous: Remote fractures involving the right lamina papyracea, right orbital floor, and right maxillary sinus are seen. Sequelae of prior ORIF with malleable plate screw fixation in place at the anterior right maxillary sinus and superolateral right orbit. Probable remotely healed right zygomatic arch fracture noted as well. No acute osseous abnormality. No discrete osseous lesions. No significant dental disease identified. Orbits: Abnormal soft tissue density infiltrates the  postseptal extraconal fat of the inferior right orbit, concerning for intraorbital cellulitis and/or phlegmon. This extends from the right maxillary sinus through the chronic right orbital floor fracture defect. Secondary mass effect on the adjacent inferior rectus muscle which is displaced superiorly. Mild mass effect on the adjacent right globe which is mildly flattened at its superior and inferior margins. Right-sided proptosis. Inflammatory process predominantly confined to the extraconal fat, with only minimal stranding seen within the intraconal fat. No extension posteriorly into the orbital apex at this time. No frank intraorbital or retro bulbar abscess. Left globe and orbital soft tissues within normal limits. Sinuses: Chronic opacification of the right maxillary sinus, right ethmoidal air cells, and right frontoethmoidal recess. Mild mucosal thickening within the right sphenoid sinus. Left paranasal sinuses are largely clear. Mastoid air cells and middle ear cavities are well pneumatized and free of fluid. Soft tissues: Associated preseptal soft tissue swelling and induration about the right eye and orbit.  No discrete abscess or drainable fluid collection. Remainder of the visualized soft tissues of the face are within normal limits. Limited intracranial: Unremarkable. IMPRESSION: 1. Abnormal soft tissue density involving the postseptal extraconal fat of the inferior right orbit, concerning for intraorbital cellulitis and/or phlegmon. Secondary mass effect on the adjacent right inferior rectus muscle and right globe with right-sided proptosis. No frank intraorbital or retro bulbar abscess. Inflammatory changes extend from the right maxillary sinus through a chronic right orbital floor fracture defect. 2. Remote fractures involving the right lamina papyracea, right orbital floor, right zygomatic arch, and right maxillary sinus. 3. Chronic right-sided paranasal sinus disease as above. Critical Value/emergent  results were called by telephone at the time of interpretation on 06/20/2019 at 10:08 pm to provider Dr. Scotty Court, Who verbally acknowledged these results. Electronically Signed   By: Rise Mu M.D.   On: 06/20/2019 22:12    ____________________________________________   PROCEDURES Procedures  ____________________________________________  DIFFERENTIAL DIAGNOSIS   Orbital cellulitis, nasolacrimal duct obstruction, preseptal cellulitis  CLINICAL IMPRESSION / ASSESSMENT AND PLAN / ED COURSE  Medications ordered in the ED: Medications  vancomycin (VANCOREADY) IVPB 1500 mg/300 mL (1,500 mg Intravenous New Bag/Given 06/20/19 2154)  piperacillin-tazobactam (ZOSYN) IVPB 3.375 g (has no administration in time range)  buPROPion (WELLBUTRIN SR) 12 hr tablet 300 mg (has no administration in time range)  testosterone cypionate (DEPOTESTOSTERONE CYPIONATE) injection 200 mg (has no administration in time range)  pantoprazole (PROTONIX) EC tablet 40 mg (has no administration in time range)  piperacillin-tazobactam (ZOSYN) IVPB 3.375 g (has no administration in time range)  vancomycin (VANCOCIN) IVPB 1000 mg/200 mL premix (has no administration in time range)  enoxaparin (LOVENOX) injection 40 mg (has no administration in time range)  0.9 %  sodium chloride infusion (has no administration in time range)  acetaminophen (TYLENOL) tablet 650 mg (has no administration in time range)    Or  acetaminophen (TYLENOL) suppository 650 mg (has no administration in time range)  traZODone (DESYREL) tablet 25 mg (has no administration in time range)  magnesium hydroxide (MILK OF MAGNESIA) suspension 30 mL (has no administration in time range)  ondansetron (ZOFRAN) tablet 4 mg (has no administration in time range)    Or  ondansetron (ZOFRAN) injection 4 mg (has no administration in time range)  HYDROmorphone (DILAUDID) 1 MG/ML injection (1 mg  Given 06/20/19 2100)  iohexol (OMNIPAQUE) 300 MG/ML solution  75 mL (75 mLs Intravenous Contrast Given 06/20/19 2136)  ondansetron (ZOFRAN) injection 4 mg (4 mg Intravenous Given 06/20/19 2153)  HYDROmorphone (DILAUDID) injection 1 mg (1 mg Intravenous Given 06/20/19 2251)    Pertinent labs & imaging results that were available during my care of the patient were reviewed by me and considered in my medical decision making (see chart for details).  Anthony Tamburo. was evaluated in Emergency Department on 06/20/2019 for the symptoms described in the history of present illness. He was evaluated in the context of the global COVID-19 pandemic, which necessitated consideration that the patient might be at risk for infection with the SARS-CoV-2 virus that causes COVID-19. Institutional protocols and algorithms that pertain to the evaluation of patients at risk for COVID-19 are in a state of rapid change based on information released by regulatory bodies including the CDC and federal and state organizations. These policies and algorithms were followed during the patient's care in the ED.   Patient presents with pronounced pain and swelling about the right eye.  Vision appears to be intact, extraocular movements and  optic nerve function are intact.  CT scan discussed with radiologist which shows orbital cellulitis with swelling and mass-effect upon the inferior rectus and the globe causing some proptosis.  No drainable abscess or other fluid collection.  Discussed with ophthalmologist Dr. Brooke Dare who notes that IV antibiotics are the only treatment, no urgent surgical intervention at this time.  Dr. Brooke Dare recommends vancomycin and Zosyn for now.  Discussed with hospitalist for further management.       ____________________________________________   FINAL CLINICAL IMPRESSION(S) / ED DIAGNOSES    Final diagnoses:  Orbital cellulitis on right     ED Discharge Orders    None      Portions of this note were generated with dragon dictation software. Dictation  errors may occur despite best attempts at proofreading.   Sharman Cheek, MD 06/20/19 2257

## 2019-06-20 NOTE — ED Notes (Signed)
Pt in with co right eye swelling, redness and drainage. States started Sunday, has had a sinus infection for few weeks. States pmd rx some antibiotics but swelling and redness did not improve. Saw ent today and ophthalmology that recommended to come to the ED for CT scan and admission. States did have an "orbital blow out" in 1994. Hx of infection to same eye few years back but states not this severe, states resolved with po antibiotics and prednisone. Pt unable to open eye due to swelling, yellow drainage noted.

## 2019-06-21 ENCOUNTER — Encounter: Payer: Self-pay | Admitting: Family Medicine

## 2019-06-21 LAB — CBC
HCT: 45.5 % (ref 39.0–52.0)
Hemoglobin: 15.1 g/dL (ref 13.0–17.0)
MCH: 26.8 pg (ref 26.0–34.0)
MCHC: 33.2 g/dL (ref 30.0–36.0)
MCV: 80.8 fL (ref 80.0–100.0)
Platelets: 205 10*3/uL (ref 150–400)
RBC: 5.63 MIL/uL (ref 4.22–5.81)
RDW: 13 % (ref 11.5–15.5)
WBC: 7.8 10*3/uL (ref 4.0–10.5)
nRBC: 0 % (ref 0.0–0.2)

## 2019-06-21 LAB — SARS CORONAVIRUS 2 (TAT 6-24 HRS): SARS Coronavirus 2: NEGATIVE

## 2019-06-21 LAB — BASIC METABOLIC PANEL
Anion gap: 9 (ref 5–15)
BUN: 15 mg/dL (ref 6–20)
CO2: 26 mmol/L (ref 22–32)
Calcium: 8.4 mg/dL — ABNORMAL LOW (ref 8.9–10.3)
Chloride: 102 mmol/L (ref 98–111)
Creatinine, Ser: 0.69 mg/dL (ref 0.61–1.24)
GFR calc Af Amer: >60 mL/min (ref >60)
GFR calc non Af Amer: >60 mL/min (ref >60)
Glucose, Bld: 139 mg/dL — ABNORMAL HIGH (ref 70–99)
Potassium: 3.6 mmol/L (ref 3.5–5.1)
Sodium: 137 mmol/L (ref 135–145)

## 2019-06-21 LAB — GLUCOSE, CAPILLARY
Glucose-Capillary: 131 mg/dL — ABNORMAL HIGH (ref 70–99)
Glucose-Capillary: 140 mg/dL — ABNORMAL HIGH (ref 70–99)
Glucose-Capillary: 146 mg/dL — ABNORMAL HIGH (ref 70–99)
Glucose-Capillary: 207 mg/dL — ABNORMAL HIGH (ref 70–99)

## 2019-06-21 LAB — HEMOGLOBIN A1C
Hgb A1c MFr Bld: 5.8 % — ABNORMAL HIGH (ref 4.8–5.6)
Mean Plasma Glucose: 119.76 mg/dL

## 2019-06-21 LAB — HIV ANTIBODY (ROUTINE TESTING W REFLEX): HIV Screen 4th Generation wRfx: NONREACTIVE

## 2019-06-21 MED ORDER — DEXAMETHASONE SODIUM PHOSPHATE 10 MG/ML IJ SOLN
10.0000 mg | Freq: Three times a day (TID) | INTRAMUSCULAR | Status: DC
  Administered 2019-06-21 – 2019-06-22 (×3): 10 mg via INTRAVENOUS
  Filled 2019-06-21 (×3): qty 1

## 2019-06-21 MED ORDER — DEXAMETHASONE SODIUM PHOSPHATE 10 MG/ML IJ SOLN: 10.0000 mg | Freq: Three times a day (TID) | INTRAMUSCULAR | 0 refills | Status: AC

## 2019-06-21 MED ORDER — MORPHINE SULFATE (PF) 2 MG/ML IV SOLN
2.0000 mg | INTRAVENOUS | Status: DC | PRN
  Administered 2019-06-21: 2 mg via INTRAVENOUS
  Filled 2019-06-21: qty 1

## 2019-06-21 MED ORDER — VANCOMYCIN HCL 1250 MG/250ML IV SOLN: 1250.0000 mg | Freq: Two times a day (BID) | INTRAVENOUS | Status: AC

## 2019-06-21 MED ORDER — INSULIN ASPART 100 UNIT/ML ~~LOC~~ SOLN
0.0000 [IU] | Freq: Every day | SUBCUTANEOUS | Status: DC
  Administered 2019-06-21: 2 [IU] via SUBCUTANEOUS
  Filled 2019-06-21: qty 1

## 2019-06-21 MED ORDER — INSULIN ASPART 100 UNIT/ML ~~LOC~~ SOLN
0.0000 [IU] | Freq: Three times a day (TID) | SUBCUTANEOUS | Status: DC
  Administered 2019-06-21 (×2): 2 [IU] via SUBCUTANEOUS
  Filled 2019-06-21 (×2): qty 1

## 2019-06-21 MED ORDER — MORPHINE SULFATE (PF) 2 MG/ML IV SOLN
2.0000 mg | INTRAVENOUS | Status: DC | PRN
  Administered 2019-06-21 (×2): 2 mg via INTRAVENOUS
  Filled 2019-06-21 (×2): qty 1

## 2019-06-21 MED ORDER — VANCOMYCIN HCL 1250 MG/250ML IV SOLN
1250.0000 mg | Freq: Two times a day (BID) | INTRAVENOUS | Status: DC
  Administered 2019-06-21 (×2): 1250 mg via INTRAVENOUS
  Filled 2019-06-21 (×4): qty 250

## 2019-06-21 MED ORDER — MORPHINE SULFATE (PF) 2 MG/ML IV SOLN: 2.0000 mg | INTRAVENOUS | Status: DC | PRN

## 2019-06-21 MED ORDER — PIPERACILLIN-TAZOBACTAM 3.375 G IVPB: 3.3750 g | Freq: Three times a day (TID) | INTRAVENOUS | Status: AC

## 2019-06-21 MED ORDER — OXYCODONE-ACETAMINOPHEN 5-325 MG PO TABS
1.0000 | ORAL_TABLET | Freq: Four times a day (QID) | ORAL | Status: DC | PRN
  Administered 2019-06-21: 1 via ORAL
  Filled 2019-06-21: qty 1

## 2019-06-21 MED ORDER — INSULIN ASPART 100 UNIT/ML ~~LOC~~ SOLN
4.0000 [IU] | Freq: Three times a day (TID) | SUBCUTANEOUS | Status: DC
  Administered 2019-06-21 – 2019-06-22 (×2): 4 [IU] via SUBCUTANEOUS
  Filled 2019-06-21 (×2): qty 1

## 2019-06-21 MED ORDER — INSULIN ASPART 100 UNIT/ML ~~LOC~~ SOLN
0.0000 [IU] | Freq: Three times a day (TID) | SUBCUTANEOUS | Status: DC
  Administered 2019-06-21: 3 [IU] via SUBCUTANEOUS
  Administered 2019-06-22: 08:00:00 7 [IU] via SUBCUTANEOUS
  Filled 2019-06-21 (×2): qty 1

## 2019-06-21 NOTE — Consult Note (Signed)
.. Oneil, [place] [id number] [birth year] Lavina Hamman, MD  Reason for Consult: Orbital cellulitis  HPI: 54 y.o. male s/p tripod fracture in 1994 treated with orbital reconstruction at [facility].  Patient reports has had some intermittent sinusitis and eye swelling for years but since September 2020, the swelling has been worse and recurring more frequently.  Beginning 5 days ago, the patient reported a change in the severity of the swelling and presented to PCP for treatment.  Given Amoxicillin at that time without significant improvement.  Returned and was given Doxycycline.  Worsened and presented to [facility] ENT who recommended evaluation in the ER with CT scan and possibly IV antibiotics.  Patient reports blurry vision at times but mostly due to discharge.  He reports good mobility of his eye.  Pain has improved over last 12 hours once he has gotten IV antibiotics.  Outside nasal endoscopy showed purulence coming from middle meatus and posterior to middle turbinate.  Allergies: No Known Allergies  ROS: Review of systems normal other than 12 systems except per HPI.  PMH:  Past Medical History:  Diagnosis Date  . Diabetes mellitus without complication ([place])   . GERD (gastroesophageal reflux disease)     FH: History reviewed. No pertinent family history.  SH:  Social History   Socioeconomic History  . Marital status: Married    Spouse name: Not on file  . Number of children: Not on file  . Years of education: Not on file  . Highest education level: Not on file  Occupational History  . Not on file  Tobacco Use  . Smoking status: Never Smoker  . Smokeless tobacco: Never Used  Substance and Sexual Activity  . Alcohol use: Yes    Comment: occassion  . Drug use: Never  . Sexual activity: Never  Other Topics Concern  . Not on file  Social History Narrative  . Not on file   Social Determinants of Health   Financial Resource Strain:   . Difficulty of Paying Living Expenses: Not  on file  Food Insecurity:   . Worried About Charity fundraiser in the Last Year: Not on file  . Ran Out of Food in the Last Year: Not on file  Transportation Needs:   . Lack of Transportation (Medical): Not on file  . Lack of Transportation (Non-Medical): Not on file  Physical Activity:   . Days of Exercise per Week: Not on file  . Minutes of Exercise per Session: Not on file  Stress:   . Feeling of Stress : Not on file  Social Connections:   . Frequency of Communication with Friends and Family: Not on file  . Frequency of Social Gatherings with Friends and Family: Not on file  . Attends Religious Services: Not on file  . Active Member of Clubs or Organizations: Not on file  . Attends Archivist Meetings: Not on file  . Marital Status: Not on file  Intimate Partner Violence:   . Fear of Current or Ex-Partner: Not on file  . Emotionally Abused: Not on file  . Physically Abused: Not on file  . Sexually Abused: Not on file    PSH:  Past Surgical History:  Procedure Laterality Date  . COLON SURGERY    . EYE SURGERY      Physical  Exam:  GEN-  NAD, sitting in bed Ophtho-  Proptosis of right eye with significant edema and induration surrounding upper and lower lip.  Suprisingly good movement of eye  with no evidence of limited gaze or pain with gaze NEURO-CN 2-12 grossly intact and symmetric EARS-  External ears clear NOSE-  Purulence on right nasal cavity NECK-  No LAD RESP-  Unlabored CARD-  RRR  CT-  Right orbital cellulitis and phlegmon with deviation of inferior rectus muscle.  No obvious fluid collection or abscess.  Previous reconstruction of orbital floor.  Previous lamina paprycea fx and ethmoid and maxillary sinusitis.   A/P: Orbital cellulitis with previous reconstruction and hardware  Plan: Discussed findings with patient and wife.  Given time course and worsening nature, patient may need hardware removal and debridement in OR.  We do not have the  ability to remove patient's hardware here and recommend referral to tertiary care center for evaluation and treatment.   Roney Mans Jeancarlo Leffler 06/21/2019 12:30 PM

## 2019-06-21 NOTE — Discharge Summary (Signed)
[facility] Discharge Summary   Patient: Christian Burke. TDD:[id number]  PCP: Dorothey Baseman, MD  Date of admission: 06/20/2019   Date of discharge:  06/21/2019     Discharge Diagnoses:  Active Problems:   Orbital cellulitis Type 2 diabetes mellitus. GERD History of orbital blowout fracture as per reconstruction at [facility] Dyslipidemia  Admitted From: Home Disposition:  Outside Hospital   Recommendations for Outpatient Follow-up:  1. PCP: Follow-up with PCP after discharge 2. Follow up LABS/TEST: None  Diet recommendation: Cardiac diet and Carb modified diet  Activity: The patient is advised to gradually reintroduce usual activities, as tolerated  Discharge Condition: stable  Code Status: Full code   History of present illness: As per the H and P dictated on admission, "Christian Burke  is a 54 y.o. male with a known history of type 2 diabetes mellitus and GERD, as well as old facial injury after which she had previous bouts of periorbital cellulitis, who presented to the emergency room with acute onset of worsening periorbital swelling with erythema and tenderness and pain since Sunday.  He was exposed to somebody smoking a couple of days earlier and started developing nasal and sinus congestion with right-sided headache.  He then noticed infraorbital swelling for which he was seen by his PCP and was given antibiotics treatment and prednisone.  He was referred by his PCP to an optometrist/ophthalmologist with a dilated eye exam and sounded normal then referred him to ENT and after being evaluated he was referred here.  He admitted to occasional nausea without vomiting.  No cough or wheezing or dyspnea or recent COVID-19 exposure.  No dysuria, oliguria or hematuria or flank pain."  Hospital Course:  Patient presented with redness and swelling of the right eye. Patient does have history of orbital fracture with reconstruction surgery performed on 1993. 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 Recently started having another episode of swelling and redness. Started on antibiotics on 06/17/2019 by PCP with amoxicillin and doxycycline. Did not improve and therefore patient was referred to ENT and ophthalmology outpatient. Patient was brought to the ER on 06/20/2019 and admitted to the hospital. After consultation with ENT and ophthalmology patient was recommended to be transferred to tertiary level center where expertise of for maxillofacial reconstruction, hardware removal is available.  Summary of his active problems in the hospital is as following.   1.  Right periorbital orbital cellulitis Failed outpatient therapy. Started on antibiotics with Augmentin and doxycycline on 06/17/2019. CT shows evidence of right orbital cellulitis, phlegmon with deviation of the inferior rectus muscle no obvious fluid collection or abscess. On exam patient does not have any visual deficit. Ophthalmology as well as ENT consulted. Patient does have prior history of reconstruction and hardware. ENT feels that the patient is to be transferred to tertiary care center for evaluation and treatment. Family understands and agrees. Currently transfer initiated tertiary level care.  Prior reconstruction performed at [facility] in [facility]. We will continue with IV vancomycin and Zosyn.  IV Decadron added per ENT recommendation.  Follow-up on cultures. Nursing informed regarding frequent visual acuity and EOMs check.  2.  Type 2 diabetes mellitus. Now on steroids On Metformin at home. Currently on IV Decadron 10 mg 3 times daily. Anticipating significant rise in patient's blood sugar. We will place the patient on resistant sliding scale every 4 hours. Monitor.  3.  GERD Continue PPI  4.  Dyslipidemia On Lipitor currently on hold  5.  Pain control With Percocet  and morphine as needed  Patient was ambulatory without any assistance.  Consultants: Ophthalmology Dr. Edison Pace,  ENT Dr. Richardson Landry and Dr. Pryor Ochoa Procedures: none  Discharge Exam: General: Appear in moderate distress, no Rash; Oral Mucosa Clear, moist. Cardiovascular: S1 and S2 Present, no Murmur, Respiratory: normal respiratory effort, Bilateral Air entry present and no Crackles, no wheezes Abdomen: Bowel Sound present, Soft and no tenderness, no hernia Extremities: no Pedal edema, no calf tenderness Neurology: alert and oriented to time, place, and person affect appropriate.  06/21/2019    06/20/2019    Filed Weights   06/20/19 1811 06/21/19 0027  Weight: 111.1 kg 104.3 kg   Vitals:   06/21/19 0548 06/21/19 1150  BP: (!) 139/92 140/83  Pulse: 84 88  Resp: 16   Temp: 97.8 F (36.6 C) 98 F (36.7 C)  SpO2: 97% 97%    DISCHARGE MEDICATION: Allergies as of 06/21/2019   No Known Allergies     Medication List    TAKE these medications   ALPRAZolam 0.25 MG tablet Commonly known as: XANAX Take 0.25 mg by mouth daily as needed.   amoxicillin 875 MG tablet Commonly known as: AMOXIL Take 875 mg by mouth 2 (two) times daily.   atorvastatin 20 MG tablet Commonly known as: LIPITOR Take 20 mg by mouth daily.   dexamethasone 10 MG/ML injection Commonly known as: DECADRON Inject 1 mL (10 mg total) into the vein every 8 (eight) hours.   doxycycline 100 MG tablet Commonly known as: VIBRA-TABS Take 100 mg by mouth 2 (two) times daily.   fluticasone 50 MCG/ACT nasal spray Commonly known as: FLONASE Place 2 sprays into both nostrils daily.   glimepiride 4 MG tablet Commonly known as: AMARYL Take 4 mg by mouth every morning.   loratadine-pseudoephedrine 10-240 MG 24 hr tablet Commonly known as: CLARITIN-D 24-hour Take 1 tablet by mouth daily.   metFORMIN 500 MG 24 hr tablet Commonly known as: GLUCOPHAGE-XR Take 1,000 mg by mouth daily with breakfast.   omeprazole 20 MG capsule Commonly known as: PRILOSEC Take 20 mg by mouth 2 (two) times daily before a meal.     piperacillin-tazobactam 3.375 GM/50ML IVPB Commonly known as: ZOSYN Inject 50 mLs (3.375 g total) into the vein every 8 (eight) hours.   predniSONE 10 MG tablet Commonly known as: DELTASONE Take 10 mg by mouth daily.   testosterone cypionate 200 MG/ML injection Commonly known as: DEPOTESTOSTERONE CYPIONATE Inject 1 mL into the muscle every 14 (fourteen) days.   vancomycin 1250 MG/250ML Soln Commonly known as: VANCOREADY Inject 250 mLs (1,250 mg total) into the vein every 12 (twelve) hours. Start taking on: June 22, 2019      No Known Allergies Discharge Instructions    Diet - low sodium heart healthy   Complete by: As directed    Increase activity slowly   Complete by: As directed       The results of significant diagnostics from this hospitalization (including imaging, microbiology, ancillary and laboratory) are listed below for reference.    Significant Diagnostic Studies: CT Maxillofacial W Contrast  Result Date: 06/20/2019 CLINICAL DATA:  Initial evaluation for acute right eye swelling, redness and drainage. EXAM: CT MAXILLOFACIAL WITH CONTRAST TECHNIQUE: Multidetector CT imaging of the maxillofacial structures was performed with intravenous contrast. Multiplanar CT image reconstructions were also generated. CONTRAST:  19mL OMNIPAQUE IOHEXOL 300 MG/ML  SOLN COMPARISON:  None available. FINDINGS: Osseous: Remote fractures involving the right lamina papyracea, right orbital floor, and right maxillary sinus are seen.  Sequelae of prior ORIF with malleable plate screw fixation in place at the anterior right maxillary sinus and superolateral right orbit. Probable remotely healed right zygomatic arch fracture noted as well. No acute osseous abnormality. No discrete osseous lesions. No significant dental disease identified. Orbits: Abnormal soft tissue density infiltrates the postseptal extraconal fat of the inferior right orbit, concerning for intraorbital cellulitis and/or  phlegmon. This extends from the right maxillary sinus through the chronic right orbital floor fracture defect. Secondary mass effect on the adjacent inferior rectus muscle which is displaced superiorly. Mild mass effect on the adjacent right globe which is mildly flattened at its superior and inferior margins. Right-sided proptosis. Inflammatory process predominantly confined to the extraconal fat, with only minimal stranding seen within the intraconal fat. No extension posteriorly into the orbital apex at this time. No frank intraorbital or retro bulbar abscess. Left globe and orbital soft tissues within normal limits. Sinuses: Chronic opacification of the right maxillary sinus, right ethmoidal air cells, and right frontoethmoidal recess. Mild mucosal thickening within the right sphenoid sinus. Left paranasal sinuses are largely clear. Mastoid air cells and middle ear cavities are well pneumatized and free of fluid. Soft tissues: Associated preseptal soft tissue swelling and induration about the right eye and orbit. No discrete abscess or drainable fluid collection. Remainder of the visualized soft tissues of the face are within normal limits. Limited intracranial: Unremarkable. IMPRESSION: 1. Abnormal soft tissue density involving the postseptal extraconal fat of the inferior right orbit, concerning for intraorbital cellulitis and/or phlegmon. Secondary mass effect on the adjacent right inferior rectus muscle and right globe with right-sided proptosis. No frank intraorbital or retro bulbar abscess. Inflammatory changes extend from the right maxillary sinus through a chronic right orbital floor fracture defect. 2. Remote fractures involving the right lamina papyracea, right orbital floor, right zygomatic arch, and right maxillary sinus. 3. Chronic right-sided paranasal sinus disease as above. Critical Value/emergent results were called by telephone at the time of interpretation on 06/20/2019 at 10:08 pm to provider  Dr. Scotty Court, Who verbally acknowledged these results. Electronically Signed   By: Rise Mu M.D.   On: 06/20/2019 22:12    Microbiology: Recent Results (from the past 240 hour(s))  SARS CORONAVIRUS 2 (TAT 6-24 HRS) Nasopharyngeal Nasopharyngeal Swab     Status: None   Collection Time: 06/20/19 10:55 PM   Specimen: Nasopharyngeal Swab  Result Value Ref Range Status   SARS Coronavirus 2 NEGATIVE NEGATIVE Final    Comment: (NOTE) SARS-CoV-2 target nucleic acids are NOT DETECTED. The SARS-CoV-2 RNA is generally detectable in upper and lower respiratory specimens during the acute phase of infection. Negative results do not preclude SARS-CoV-2 infection, do not rule out co-infections with other pathogens, and should not be used as the sole basis for treatment or other patient management decisions. Negative results must be combined with clinical observations, patient history, and epidemiological information. The expected result is Negative. Fact Sheet for Patients: HairSlick.no Fact Sheet for Healthcare Providers: quierodirigir.com This test is not yet approved or cleared by the [place] FDA and  has been authorized for detection and/or diagnosis of SARS-CoV-2 by FDA under an Emergency Use Authorization (EUA). This EUA will remain  in effect (meaning this test can be used) for the duration of the COVID-19 declaration under Section 56 4(b)(1) of the Act, 21 U.S.C. section 360bbb-3(b)(1), unless the authorization is terminated or revoked sooner. Performed at [facility], [address] [place], [place] [address]      Labs:  CBC: Recent Labs  Lab 06/20/19 2054 06/21/19 0427  WBC 8.9 7.8  NEUTROABS 6.5  --   HGB 15.8 15.1  HCT 48.1 45.5  MCV 81.3 80.8  PLT 229 205   Basic Metabolic Panel: Recent Labs  Lab 06/20/19 2054 06/21/19 0427  NA 138 137  K 3.7 3.6  CL 100 102  CO2 28 26  GLUCOSE 156*  139*  BUN 20 15  CREATININE 0.91 0.69  CALCIUM 9.3 8.4*   Liver Function Tests: No results for input(s): AST, ALT, ALKPHOS, BILITOT, PROT, ALBUMIN in the last 168 hours. No results for input(s): LIPASE, AMYLASE in the last 168 hours. No results for input(s): AMMONIA in the last 168 hours. Cardiac Enzymes: No results for input(s): CKTOTAL, CKMB, CKMBINDEX, TROPONINI in the last 168 hours. BNP (last 3 results) No results for input(s): BNP in the last 8760 hours. CBG: Recent Labs  Lab 06/21/19 0806 06/21/19 1147 06/21/19 1643  GLUCAP 131* 140* 146*    Time spent: 35 minutes  Signed:  Lynden Oxford  [facility]  06/21/2019 5:00 PM

## 2019-06-21 NOTE — Plan of Care (Signed)
Continuing with plan of care. 

## 2019-06-21 NOTE — Progress Notes (Signed)
Pharmacy Antibiotic Note  Christian Burke. is a 54 y.o. male admitted on 06/20/2019 with cellulitis.  Pharmacy has been consulted for vancomycin/zosyn dosing.  Patient received vanc 2.25g IV load and zosyn 3.375g IV x 1   SCr 0.91 >0.69 Weight 111.1 kg > 104.3 kg  Plan: Vancomycin 1250 mg IV Q 12 hrs. Goal AUC 400-550. Expected AUC: 461 SCr used: 0.8 Cssmin: 11.3  Will continue Zosyn 3.375g IV q8h EI and continue to monitor.  Will order SCr in AM to continue to monitor renal function.   Height: 6' (182.9 cm) Weight: 230 lb (104.3 kg) IBW/kg (Calculated) : 77.6  Temp (24hrs), Avg:97.8 F (36.6 C), Min:97.3 F (36.3 C), Max:98.4 F (36.9 C)  Recent Labs  Lab 06/20/19 2054 06/21/19 0427  WBC 8.9 7.8  CREATININE 0.91 0.69    Estimated Creatinine Clearance: 136.4 mL/min (by C-G formula based on SCr of 0.69 mg/dL).    No Known Allergies   Vanc 1/28 >> Zosyn 1/29 >>  No cultures available  Thank you for allowing pharmacy to be a part of this patient's care.  Crist Fat, PharmD, BCPS Clinical Pharmacist 06/21/2019 10:21 AM

## 2019-06-21 NOTE — Progress Notes (Addendum)
[facility] Progress Note  Patient: Christian Burke.    XFG:[id number]  DOA: 06/20/2019     Date of Service: the patient was seen and examined on 06/21/2019  Chief Complaint  Patient presents with  . Facial Swelling   Brief hospital course: Type II DM, GERD, prior facial injury with recurrent periorbital cellulitis.  Presents with complaints of orbital cellulitis. Currently further plan is continue IV antibiotics and arrange for transfer to tertiary center.  Assessment and Plan: 1.  Right periorbital orbital cellulitis Failed outpatient therapy. Started on antibiotics with Augmentin and doxycycline on 06/17/2019. CT shows evidence of right orbital cellulitis, phlegmon with deviation of the inferior rectus muscle no obvious fluid collection or abscess. On exam patient does not have any visual deficit. Ophthalmology as well as ENT consulted. Patient does have prior history of reconstruction and hardware. ENT feels that the patient is to be transferred to tertiary care center for evaluation and treatment. Family understands and agrees. Currently transfer initiated at [facility] as well as [facility].  Both are on diversion for now.  Prior reconstruction performed at [facility] in [facility]. We will continue with IV vancomycin and Zosyn.  IV Decadron added per ENT recommendation.  Follow-up on cultures. Nursing informed regarding frequent visual acuity and EOMs check.  2.  Type 2 diabetes mellitus. Now on steroids On Metformin at home. Currently on IV Decadron 10 mg 3 times daily. Anticipating significant rise in patient's blood sugar. We will place the patient on resistant sliding scale every 4 hours. Monitor.  3.  GERD Continue PPI  4.  Dyslipidemia On Lipitor currently on hold  5.  Pain control With Percocet and morphine as needed  Diet: Carb modified diet DVT Prophylaxis: Subcutaneous Lovenox   Advance goals of care discussion: Full code  Family Communication: family was present at  bedside, at the time of interview.  The pt provided permission to discuss medical plan with the family. Opportunity was given to ask question and all questions were answered satisfactorily.   Disposition:  Pt is from home, admitted with orbital cellulitis, still has significant orbital cellulitis, which precludes a safe discharge. Patient will be transferred to either [facility] or [facility] whenever a bed is available.  Subjective: Continues to report swelling.  Pain is still present.  Also feels pressure.  No nausea no vomiting.  Physical Exam: General: alert oriented to time, place, and person.  Appear in moderate distress, affect appropriate Eyes pain while moving his eye in vertical direction.  Denies any visual changes ENT: Oral Mucosa Clear, moist  Neck: no JVD,  Cardiovascular: S1 and S2 Present, no Murmur,  Respiratory: good respiratory effort, Bilateral Air entry equal and Decreased, no Crackles, no wheezes Abdomen: Bowel Sound present, Soft and no tenderness,  Skin: no rash  Extremities: no Pedal edema, no calf tenderness Neurologic: without any new focal findings Gait not checked due to patient safety concerns   Vitals:   06/20/19 2328 06/21/19 0027 06/21/19 0548 06/21/19 1150  BP: (!) 150/86 (!) 147/95 (!) 139/92 140/83  Pulse: 92 83 84 88  Resp: 20 18 16    Temp:  98.4 F (36.9 C) 97.8 F (36.6 C) 98 F (36.7 C)  TempSrc:  Oral Oral Oral  SpO2: 97% 96% 97% 97%  Weight:  104.3 kg    Height:  6' (1.829 m)      Intake/Output Summary (Last 24 hours) at 06/21/2019 1636 Last data filed at 06/21/2019 1600 Gross per 24 hour  Intake 699.35 ml  Output 1350 ml  Net -650.65 ml   Filed Weights   06/20/19 1811 06/21/19 0027  Weight: 111.1 kg 104.3 kg   Data Reviewed: I have personally reviewed and interpreted daily labs, tele strips, imagings as discussed above. I reviewed all nursing notes, pharmacy notes, vitals, pertinent old records I have  discussed plan of care as described above with RN and patient/family.  CBC: Recent Labs  Lab 06/20/19 2054 06/21/19 0427  WBC 8.9 7.8  NEUTROABS 6.5  --   HGB 15.8 15.1  HCT 48.1 45.5  MCV 81.3 80.8  PLT 229 205   Basic Metabolic Panel: Recent Labs  Lab 06/20/19 2054 06/21/19 0427  NA 138 137  K 3.7 3.6  CL 100 102  CO2 28 26  GLUCOSE 156* 139*  BUN 20 15  CREATININE 0.91 0.69  CALCIUM 9.3 8.4*    Studies: CT Maxillofacial W Contrast  Result Date: 06/20/2019 CLINICAL DATA:  Initial evaluation for acute right eye swelling, redness and drainage. EXAM: CT MAXILLOFACIAL WITH CONTRAST TECHNIQUE: Multidetector CT imaging of the maxillofacial structures was performed with intravenous contrast. Multiplanar CT image reconstructions were also generated. CONTRAST:  48mL OMNIPAQUE IOHEXOL 300 MG/ML  SOLN COMPARISON:  None available. FINDINGS: Osseous: Remote fractures involving the right lamina papyracea, right orbital floor, and right maxillary sinus are seen. Sequelae of prior ORIF with malleable plate screw fixation in place at the anterior right maxillary sinus and superolateral right orbit. Probable remotely healed right zygomatic arch fracture noted as well. No acute osseous abnormality. No discrete osseous lesions. No significant dental disease identified. Orbits: Abnormal soft tissue density infiltrates the postseptal extraconal fat of the inferior right orbit, concerning for intraorbital cellulitis and/or phlegmon. This extends from the right maxillary sinus through the chronic right orbital floor fracture defect. Secondary mass effect on the adjacent inferior rectus muscle which is displaced superiorly. Mild mass effect on the adjacent right globe which is mildly flattened at its superior and inferior margins. Right-sided proptosis. Inflammatory process predominantly confined to the extraconal fat, with only minimal stranding seen within the intraconal fat. No extension posteriorly into  the orbital apex at this time. No frank intraorbital or retro bulbar abscess. Left globe and orbital soft tissues within normal limits. Sinuses: Chronic opacification of the right maxillary sinus, right ethmoidal air cells, and right frontoethmoidal recess. Mild mucosal thickening within the right sphenoid sinus. Left paranasal sinuses are largely clear. Mastoid air cells and middle ear cavities are well pneumatized and free of fluid. Soft tissues: Associated preseptal soft tissue swelling and induration about the right eye and orbit. No discrete abscess or drainable fluid collection. Remainder of the visualized soft tissues of the face are within normal limits. Limited intracranial: Unremarkable. IMPRESSION: 1. Abnormal soft tissue density involving the postseptal extraconal fat of the inferior right orbit, concerning for intraorbital cellulitis and/or phlegmon. Secondary mass effect on the adjacent right inferior rectus muscle and right globe with right-sided proptosis. No frank intraorbital or retro bulbar abscess. Inflammatory changes extend from the right maxillary sinus through a chronic right orbital floor fracture defect. 2. Remote fractures involving the right lamina papyracea, right orbital floor, right zygomatic arch, and right maxillary sinus. 3. Chronic right-sided paranasal sinus disease as above. Critical Value/emergent results were called by telephone at the time of interpretation on 06/20/2019 at 10:08 pm to provider Dr. Scotty Court, Who verbally acknowledged these results. Electronically Signed   By: Rise Mu M.D.   On: 06/20/2019 22:12  Scheduled Meds: . dexamethasone (DECADRON) injection  10 mg Intravenous Q8H  . enoxaparin (LOVENOX) injection  40 mg Subcutaneous Q24H  . insulin aspart  0-20 Units Subcutaneous TID WC  . insulin aspart  0-5 Units Subcutaneous QHS  . insulin aspart  4 Units Subcutaneous TID WC  . pantoprazole  40 mg Oral Daily   Continuous Infusions: . sodium  chloride 100 mL/hr at 06/21/19 0023  . piperacillin-tazobactam (ZOSYN)  IV 3.375 g (06/21/19 0827)  . vancomycin Stopped (06/21/19 1412)   PRN Meds: acetaminophen **OR** acetaminophen, magnesium hydroxide, morphine injection, ondansetron **OR** ondansetron (ZOFRAN) IV, oxyCODONE-acetaminophen, traZODone  Time spent: 35 minutes  Additional 25 minutes spent in coordinating transfer to tertiary center.  Author: Berle Mull, MD [facility] 06/21/2019 4:36 PM  To reach On-call, see care teams to locate the attending and reach out to them via [email or web address]. If 7PM-7AM, please contact night-coverage If you still have difficulty reaching the attending provider, please page the [facility] (Director on Call) for [facility] on amion for assistance.

## 2019-06-21 NOTE — Progress Notes (Signed)
Report given to the nurse that will be taking care of the patient at [facility].  [facility] will be the ones transporting the patient and will call back with a time closer to transfer.

## 2019-06-21 NOTE — Progress Notes (Signed)
Pharmacy Antibiotic Note  Christian Burke. is a 54 y.o. male admitted on 06/20/2019 with cellulitis.  Pharmacy has been consulted for vancomycin/zosyn dosing.  Plan: Patient received vanc 2.25g IV load and zosyn 3.375g IV x 1  Vancomycin 1250 mg IV Q 12 hrs. Goal AUC 400-550. Expected AUC: 489.7 SCr used: 0.91 Cssmin: 12.8  Will continue zosyn 3.375g IV q8h and continue to monitor.  Height: 6' (182.9 cm) Weight: 245 lb (111.1 kg) IBW/kg (Calculated) : 77.6  Temp (24hrs), Avg:97.9 F (36.6 C), Min:97.3 F (36.3 C), Max:98.4 F (36.9 C)  Recent Labs  Lab 06/20/19 2054  WBC 8.9  CREATININE 0.91    Estimated Creatinine Clearance: 123.6 mL/min (by C-G formula based on SCr of 0.91 mg/dL).    No Known Allergies  Thank you for allowing pharmacy to be a part of this patient's care.  Thomasene Ripple, PharmD, BCPS Clinical Pharmacist 06/21/2019 1:45 AM

## 2019-06-21 NOTE — Consult Note (Signed)
Reason for Consult: orbital cellulitis, right eye Referring Physician: ED, Sharman Cheek Chief complaint: pain and swelling around right eye.  HPI: Christian Burke. is an 54 y.o. male with PMH of DM and history of orbital fracture in 1993 admitted to [facility] for treatment of orbital cellulitis  He reports that he was doing well until September 2020 he started experiencing sinus infections treated by PCP with oral antibiotics and prednisone and symptoms improved initially, but had ongoing sinus issues which mostly spared his eye.    Swelling and periorbital pain worsened and at his last followup with PCP he was referred to Dr. Clydene Pugh [facility] optometrist) and Dr. Elenore Rota (ENT) on Thursday, who referred him to [facility] ED for urgent IV antibiotics and CT scan maxillofacial.  Since then he reports some improvement in pain.  He denies diplopia or flashes of light.  He reports that his vision seems stable.   Past Medical History:  Diagnosis Date  . Diabetes mellitus without complication (HCC)   . GERD (gastroesophageal reflux disease)     ROS  Past Surgical History:  Procedure Laterality Date  . COLON SURGERY    . EYE SURGERY      History reviewed. No pertinent family history.  Social History:  reports that he has never smoked. He has never used smokeless tobacco. He reports current alcohol use. He reports that he does not use drugs.  Allergies: No Known Allergies  Prior to Admission medications   Medication Sig Start Date End Date Taking? Authorizing Provider  ALPRAZolam (XANAX) 0.25 MG tablet Take 0.25 mg by mouth daily as needed. 03/25/19  Yes [provider]  atorvastatin (LIPITOR) 20 MG tablet Take 20 mg by mouth daily. 03/25/19  Yes [provider]  fluticasone (FLONASE) 50 MCG/ACT nasal spray Place 2 sprays into both nostrils daily.   Yes [provider]  glimepiride (AMARYL) 4 MG tablet Take 4 mg by mouth every morning. 03/25/19  Yes [provider]  loratadine-pseudoephedrine (CLARITIN-D 24-HOUR) 10-240 MG 24 hr tablet Take 1 tablet by mouth daily.   Yes [provider]  metFORMIN (GLUCOPHAGE-XR) 500 MG 24 hr tablet Take 1,000 mg by mouth daily with breakfast.    Yes [provider]  omeprazole (PRILOSEC) 20 MG capsule Take 20 mg by mouth 2 (two) times daily before a meal.    Yes [provider]  predniSONE (DELTASONE) 10 MG tablet Take 10 mg by mouth daily. 06/17/19  Yes [provider]  testosterone cypionate (DEPOTESTOSTERONE CYPIONATE) 200 MG/ML injection Inject 1 mL into the muscle every 14 (fourteen) days.   Yes [provider]  amoxicillin (AMOXIL) 875 MG tablet Take 875 mg by mouth 2 (two) times daily. 06/17/19   [provider]  doxycycline (VIBRA-TABS) 100 MG tablet Take 100 mg by mouth 2 (two) times daily. 06/20/19   [provider]    Results for orders placed or performed during the hospital encounter of 06/20/19 (from the past 48 hour(s))  Basic metabolic panel     Status: Abnormal   Collection Time: 06/20/19  8:54 PM  Result Value Ref Range   Sodium 138 135 - 145 mmol/L   Potassium 3.7 3.5 - 5.1 mmol/L   Chloride 100 98 - 111 mmol/L   CO2 28 22 - 32 mmol/L   Glucose, Bld 156 (H) 70 - 99 mg/dL   BUN 20 6 - 20 mg/dL   Creatinine, Ser 1.82 0.61 - 1.24 mg/dL   Calcium 9.3 8.9 -  10.3 mg/dL   GFR calc non Af Amer >60 >60 mL/min   GFR calc Af Amer >60 >60 mL/min   Anion gap 10 5 - 15    Comment: Performed at [facility], [address] [place], [place] [address]  CBC with Differential     Status: Abnormal   Collection Time: 06/20/19  8:54 PM  Result Value Ref Range   WBC 8.9 4.0 - 10.5 K/uL   RBC 5.92 (H) 4.22 - 5.81 MIL/uL   Hemoglobin 15.8 13.0 - 17.0 g/dL   HCT 48.1 39.0 - 52.0 %   MCV 81.3 80.0 - 100.0 fL   MCH 26.7 26.0 - 34.0 pg   MCHC 32.8 30.0 - 36.0 g/dL   RDW 13.0 11.5 - 15.5 %   Platelets 229 150 - 400 K/uL   nRBC 0.0  0.0 - 0.2 %   Neutrophils Relative % 71 %   Neutro Abs 6.5 1.7 - 7.7 K/uL   Lymphocytes Relative 15 %   Lymphs Abs 1.3 0.7 - 4.0 K/uL   Monocytes Relative 10 %   Monocytes Absolute 0.9 0.1 - 1.0 K/uL   Eosinophils Relative 2 %   Eosinophils Absolute 0.2 0.0 - 0.5 K/uL   Basophils Relative 1 %   Basophils Absolute 0.0 0.0 - 0.1 K/uL   Immature Granulocytes 1 %   Abs Immature Granulocytes 0.04 0.00 - 0.07 K/uL    Comment: Performed at [facility], [address] [place], [place] [address]  Basic metabolic panel     Status: Abnormal   Collection Time: 06/21/19  4:27 AM  Result Value Ref Range   Sodium 137 135 - 145 mmol/L   Potassium 3.6 3.5 - 5.1 mmol/L   Chloride 102 98 - 111 mmol/L   CO2 26 22 - 32 mmol/L   Glucose, Bld 139 (H) 70 - 99 mg/dL   BUN 15 6 - 20 mg/dL   Creatinine, Ser 0.69 0.61 - 1.24 mg/dL   Calcium 8.4 (L) 8.9 - 10.3 mg/dL   GFR calc non Af Amer >60 >60 mL/min   GFR calc Af Amer >60 >60 mL/min   Anion gap 9 5 - 15    Comment: Performed at [facility], [address] [place], [place] [address]  CBC     Status: None   Collection Time: 06/21/19  4:27 AM  Result Value Ref Range   WBC 7.8 4.0 - 10.5 K/uL   RBC 5.63 4.22 - 5.81 MIL/uL   Hemoglobin 15.1 13.0 - 17.0 g/dL   HCT 45.5 39.0 - 52.0 %   MCV 80.8 80.0 - 100.0 fL   MCH 26.8 26.0 - 34.0 pg   MCHC 33.2 30.0 - 36.0 g/dL   RDW 13.0 11.5 - 15.5 %   Platelets 205 150 - 400 K/uL   nRBC 0.0 0.0 - 0.2 %    Comment: Performed at [facility], [address] [place], [place] [address]    CT Maxillofacial W Contrast  Result Date: 06/20/2019 CLINICAL DATA:  Initial evaluation for acute right eye swelling, redness and drainage. EXAM: CT MAXILLOFACIAL WITH CONTRAST TECHNIQUE: Multidetector CT imaging of the maxillofacial structures was performed with intravenous contrast. Multiplanar CT image reconstructions were also generated. CONTRAST:  61mL OMNIPAQUE IOHEXOL 300 MG/ML  SOLN  COMPARISON:  None available. FINDINGS: Osseous: Remote fractures involving the right lamina papyracea, right orbital floor, and right maxillary sinus are seen. Sequelae of prior ORIF with malleable plate screw fixation in  place at the anterior right maxillary sinus and superolateral right orbit. Probable remotely healed right zygomatic arch fracture noted as well. No acute osseous abnormality. No discrete osseous lesions. No significant dental disease identified. Orbits: Abnormal soft tissue density infiltrates the postseptal extraconal fat of the inferior right orbit, concerning for intraorbital cellulitis and/or phlegmon. This extends from the right maxillary sinus through the chronic right orbital floor fracture defect. Secondary mass effect on the adjacent inferior rectus muscle which is displaced superiorly. Mild mass effect on the adjacent right globe which is mildly flattened at its superior and inferior margins. Right-sided proptosis. Inflammatory process predominantly confined to the extraconal fat, with only minimal stranding seen within the intraconal fat. No extension posteriorly into the orbital apex at this time. No frank intraorbital or retro bulbar abscess. Left globe and orbital soft tissues within normal limits. Sinuses: Chronic opacification of the right maxillary sinus, right ethmoidal air cells, and right frontoethmoidal recess. Mild mucosal thickening within the right sphenoid sinus. Left paranasal sinuses are largely clear. Mastoid air cells and middle ear cavities are well pneumatized and free of fluid. Soft tissues: Associated preseptal soft tissue swelling and induration about the right eye and orbit. No discrete abscess or drainable fluid collection. Remainder of the visualized soft tissues of the face are within normal limits. Limited intracranial: Unremarkable. IMPRESSION: 1. Abnormal soft tissue density involving the postseptal extraconal fat of the inferior right orbit, concerning for  intraorbital cellulitis and/or phlegmon. Secondary mass effect on the adjacent right inferior rectus muscle and right globe with right-sided proptosis. No frank intraorbital or retro bulbar abscess. Inflammatory changes extend from the right maxillary sinus through a chronic right orbital floor fracture defect. 2. Remote fractures involving the right lamina papyracea, right orbital floor, right zygomatic arch, and right maxillary sinus. 3. Chronic right-sided paranasal sinus disease as above. Critical Value/emergent results were called by telephone at the time of interpretation on 06/20/2019 at 10:08 pm to provider Dr. Scotty Court, Who verbally acknowledged these results. Electronically Signed   By: Rise Mu M.D.   On: 06/20/2019 22:12    Blood pressure (!) 139/92, pulse 84, temperature 97.8 F (36.6 C), temperature source Oral, resp. rate 16, height 6' (1.829 m), weight 104.3 kg, SpO2 97 %.  Mental status: Alert and Oriented x 4  Visual Acuity:  20/50 OD  20/30 near [place]  Pupils:  Equally round/ reactive to light.  No Afferent defect.  Motility:  Full/ orthophoric2  Visual Fields:  Full to confrontation  IOP:  27 mm OD, 22 mm OS.  External/ Lids/ Lashes:  Swollen periorbit, erythema and tenderness to palpation lower lid.  RIght eye lid with mucoid discharge, matted shut, proptosis, partially ballotable globe, but tender and relatively firm orbit.    Anterior Segment:  Conjunctiva:  Mild chemosis OD  Cornea:  Normal  OU  Anterior Chamber: Normal  OU  Lens:   Normal OU  Posterior Segment: Dilated OU with 1% Tropicamide and 2.5% Phenylephrine  Discs:   Normal c/d ratio, no pallor, no edema OU  Macula:  Normal  Vessels/ Periphery: Normal    Assessment/Plan: 1.  Orbital cellulitis/contiguous spread from sinusitis without focal abscess.  Agree with broad spectrum antibiotics until swelling improves then consider transition to oral antibiotics.  Patient with significant proptosis  and only mild increased intraocular pressure, no optic neuropathy or indication for canthotomy.  ENT consult to consider orbital floor reconstruction and sinus debridement.  WIll continue to follow daily for now.   Christian Burke  Christian Burke 06/21/2019, 7:38 AM

## 2019-06-22 ENCOUNTER — Ambulatory Visit (HOSPITAL_COMMUNITY)
Admission: AD | Admit: 2019-06-22 | Discharge: 2019-06-22 | Disposition: A | Discharge status: Home / Self Care | Payer: BC Managed Care – PPO | Source: Other Acute Inpatient Hospital | Attending: Otolaryngology | Admitting: Otolaryngology

## 2019-06-22 DIAGNOSIS — H05019 Cellulitis of unspecified orbit: Secondary | ICD-10-CM | POA: Insufficient documentation

## 2019-06-22 LAB — CREATININE, SERUM
Creatinine, Ser: 0.66 mg/dL (ref 0.61–1.24)
GFR calc Af Amer: >60 mL/min (ref >60)
GFR calc non Af Amer: >60 mL/min (ref >60)

## 2019-06-22 LAB — GLUCOSE, CAPILLARY: Glucose-Capillary: 221 mg/dL — ABNORMAL HIGH (ref 70–99)

## 2019-06-22 NOTE — Progress Notes (Signed)
Report given to care link and patient transferred in stable condition with all belongings.

## 2019-06-22 NOTE — Plan of Care (Signed)
Continue with plan of care.  

## 2019-06-22 NOTE — Discharge Summary (Signed)
[facility] Discharge Summary   Patient: Christian Burke. MWU:[id number]  PCP: Dorothey Baseman, MD  Date of admission: 06/20/2019   Date of discharge:  06/22/2019     Discharge Diagnoses:  Active Problems:   Orbital cellulitis Type 2 diabetes mellitus. GERD History of orbital blowout fracture as per reconstruction at [facility] Dyslipidemia  Admitted From: Home Disposition:  Outside Hospital   Recommendations for Outpatient Follow-up:  1. PCP: Follow-up with PCP after discharge 2. Follow up LABS/TEST: None  Follow-up Information    Dorothey Baseman, MD. Schedule an appointment as soon as possible for a visit in 1 week(s).   Specialty: Family Medicine Contact information: [address] [place] [address] [phone]         Diet recommendation: Cardiac diet and Carb modified diet  Activity: The patient is advised to gradually reintroduce usual activities, as tolerated  Discharge Condition: stable  Code Status: Full code   History of present illness: As per the H and P dictated on admission, "Christian Burke  is a 54 y.o. male with a known history of type 2 diabetes mellitus and GERD, as well as old facial injury after which she had previous bouts of periorbital cellulitis, who presented to the emergency room with acute onset of worsening periorbital swelling with erythema and tenderness and pain since Sunday.  He was exposed to somebody smoking a couple of days earlier and started developing nasal and sinus congestion with right-sided headache.  He then noticed infraorbital swelling for which he was seen by his PCP and was given antibiotics treatment and prednisone.  He was referred by his PCP to an optometrist/ophthalmologist with a dilated eye exam and sounded normal then referred him to ENT and after being evaluated he was referred here.  He admitted to occasional nausea without vomiting.  No cough or wheezing or dyspnea or recent COVID-19 exposure.  No dysuria,  oliguria or hematuria or flank pain."  Hospital Course:  Patient presented with redness and swelling of the right eye. Patient does have history of orbital fracture with reconstruction surgery performed on 1993. Had recurrent periorbital cellulitis after that. Recently started having another episode of swelling and redness. Started on antibiotics on 06/17/2019 by PCP with amoxicillin and doxycycline. Did not improve and therefore patient was referred to ENT and ophthalmology outpatient. Patient was brought to the ER on 06/20/2019 and admitted to the hospital. After consultation with ENT and ophthalmology patient was recommended to be transferred to tertiary level center where expertise of for maxillofacial reconstruction, hardware removal is available.  Summary of his active problems in the hospital is as following.   1.  Right periorbital orbital cellulitis Failed outpatient therapy. Started on antibiotics with Augmentin and doxycycline on 06/17/2019. CT shows evidence of right orbital cellulitis, phlegmon with deviation of the inferior rectus muscle no obvious fluid collection or abscess. On exam patient does not have any visual deficit. Ophthalmology as well as ENT consulted. Patient does have prior history of reconstruction and hardware. ENT feels that the patient is to be transferred to tertiary care center for evaluation and treatment. Family understands and agrees. Currently transfer initiated tertiary level care.  Prior reconstruction performed at [facility] in [facility]. We will continue with IV vancomycin and Zosyn.  IV Decadron added per ENT recommendation.  Follow-up on cultures. Nursing informed regarding frequent visual acuity and EOMs check.  2.  Type 2 diabetes mellitus. Now on steroids On Metformin at home. Currently on IV Decadron 10 mg 3  times daily. Anticipating significant rise in patient's blood sugar. We will place the patient on resistant sliding scale every 4  hours. Monitor.  3.  GERD Continue PPI  4.  Dyslipidemia On Lipitor currently on hold  5.  Pain control With Percocet and morphine as needed  Patient was ambulatory without any assistance.  Consultants: Ophthalmology Dr. Brooke Dare, ENT Dr. Willeen Cass and Dr. Andee Poles Procedures: none  Discharge Exam: General: Appear in moderate distress, no Rash; Oral Mucosa Clear, moist. Cardiovascular: S1 and S2 Present, no Murmur, Respiratory: normal respiratory effort, Bilateral Air entry present and no Crackles, no wheezes Abdomen: Bowel Sound present, Soft and no tenderness, no hernia Extremities: no Pedal edema, no calf tenderness Neurology: alert and oriented to time, place, and person affect appropriate.  Significant improvement on exam, still lateral gaze restricted on right with some mild pain. No vision changes.   06/22/2019     06/21/2019    06/20/2019    Filed Weights   06/20/19 1811 06/21/19 0027  Weight: 111.1 kg 104.3 kg   Vitals:   06/22/19 0438 06/22/19 0838  BP: (!) 137/95 (!) 142/96  Pulse: 87 92  Resp: 20 18  Temp: 97.8 F (36.6 C) 98 F (36.7 C)  SpO2: 97% 100%    DISCHARGE MEDICATION: Allergies as of 06/22/2019   No Known Allergies     Medication List    TAKE these medications   ALPRAZolam 0.25 MG tablet Commonly known as: XANAX Take 0.25 mg by mouth daily as needed.   amoxicillin 875 MG tablet Commonly known as: AMOXIL Take 875 mg by mouth 2 (two) times daily.   atorvastatin 20 MG tablet Commonly known as: LIPITOR Take 20 mg by mouth daily.   dexamethasone 10 MG/ML injection Commonly known as: DECADRON Inject 1 mL (10 mg total) into the vein every 8 (eight) hours.   doxycycline 100 MG tablet Commonly known as: VIBRA-TABS Take 100 mg by mouth 2 (two) times daily.   fluticasone 50 MCG/ACT nasal spray Commonly known as: FLONASE Place 2 sprays into both nostrils daily.   glimepiride 4 MG tablet Commonly known as: AMARYL Take 4 mg by  mouth every morning.   loratadine-pseudoephedrine 10-240 MG 24 hr tablet Commonly known as: CLARITIN-D 24-hour Take 1 tablet by mouth daily.   metFORMIN 500 MG 24 hr tablet Commonly known as: GLUCOPHAGE-XR Take 1,000 mg by mouth daily with breakfast.   omeprazole 20 MG capsule Commonly known as: PRILOSEC Take 20 mg by mouth 2 (two) times daily before a meal.   piperacillin-tazobactam 3.375 GM/50ML IVPB Commonly known as: ZOSYN Inject 50 mLs (3.375 g total) into the vein every 8 (eight) hours.   predniSONE 10 MG tablet Commonly known as: DELTASONE Take 10 mg by mouth daily.   testosterone cypionate 200 MG/ML injection Commonly known as: DEPOTESTOSTERONE CYPIONATE Inject 1 mL into the muscle every 14 (fourteen) days.   vancomycin 1250 MG/250ML Soln Commonly known as: VANCOREADY Inject 250 mLs (1,250 mg total) into the vein every 12 (twelve) hours.      No Known Allergies Discharge Instructions    Diet - low sodium heart healthy   Complete by: As directed    Increase activity slowly   Complete by: As directed       The results of significant diagnostics from this hospitalization (including imaging, microbiology, ancillary and laboratory) are listed below for reference.    Significant Diagnostic Studies: CT Maxillofacial W Contrast  Result Date: 06/20/2019 CLINICAL DATA:  Initial evaluation for acute  right eye swelling, redness and drainage. EXAM: CT MAXILLOFACIAL WITH CONTRAST TECHNIQUE: Multidetector CT imaging of the maxillofacial structures was performed with intravenous contrast. Multiplanar CT image reconstructions were also generated. CONTRAST:  44mL OMNIPAQUE IOHEXOL 300 MG/ML  SOLN COMPARISON:  None available. FINDINGS: Osseous: Remote fractures involving the right lamina papyracea, right orbital floor, and right maxillary sinus are seen. Sequelae of prior ORIF with malleable plate screw fixation in place at the anterior right maxillary sinus and superolateral right  orbit. Probable remotely healed right zygomatic arch fracture noted as well. No acute osseous abnormality. No discrete osseous lesions. No significant dental disease identified. Orbits: Abnormal soft tissue density infiltrates the postseptal extraconal fat of the inferior right orbit, concerning for intraorbital cellulitis and/or phlegmon. This extends from the right maxillary sinus through the chronic right orbital floor fracture defect. Secondary mass effect on the adjacent inferior rectus muscle which is displaced superiorly. Mild mass effect on the adjacent right globe which is mildly flattened at its superior and inferior margins. Right-sided proptosis. Inflammatory process predominantly confined to the extraconal fat, with only minimal stranding seen within the intraconal fat. No extension posteriorly into the orbital apex at this time. No frank intraorbital or retro bulbar abscess. Left globe and orbital soft tissues within normal limits. Sinuses: Chronic opacification of the right maxillary sinus, right ethmoidal air cells, and right frontoethmoidal recess. Mild mucosal thickening within the right sphenoid sinus. Left paranasal sinuses are largely clear. Mastoid air cells and middle ear cavities are well pneumatized and free of fluid. Soft tissues: Associated preseptal soft tissue swelling and induration about the right eye and orbit. No discrete abscess or drainable fluid collection. Remainder of the visualized soft tissues of the face are within normal limits. Limited intracranial: Unremarkable. IMPRESSION: 1. Abnormal soft tissue density involving the postseptal extraconal fat of the inferior right orbit, concerning for intraorbital cellulitis and/or phlegmon. Secondary mass effect on the adjacent right inferior rectus muscle and right globe with right-sided proptosis. No frank intraorbital or retro bulbar abscess. Inflammatory changes extend from the right maxillary sinus through a chronic right orbital  floor fracture defect. 2. Remote fractures involving the right lamina papyracea, right orbital floor, right zygomatic arch, and right maxillary sinus. 3. Chronic right-sided paranasal sinus disease as above. Critical Value/emergent results were called by telephone at the time of interpretation on 06/20/2019 at 10:08 pm to provider Dr. Joni Fears, Who verbally acknowledged these results. Electronically Signed   By: Jeannine Boga M.D.   On: 06/20/2019 22:12    Microbiology: Recent Results (from the past 240 hour(s))  SARS CORONAVIRUS 2 (TAT 6-24 HRS) Nasopharyngeal Nasopharyngeal Swab     Status: None   Collection Time: 06/20/19 10:55 PM   Specimen: Nasopharyngeal Swab  Result Value Ref Range Status   SARS Coronavirus 2 NEGATIVE NEGATIVE Final    Comment: (NOTE) SARS-CoV-2 target nucleic acids are NOT DETECTED. The SARS-CoV-2 RNA is generally detectable in upper and lower respiratory specimens during the acute phase of infection. Negative results do not preclude SARS-CoV-2 infection, do not rule out co-infections with other pathogens, and should not be used as the sole basis for treatment or other patient management decisions. Negative results must be combined with clinical observations, patient history, and epidemiological information. The expected result is Negative. Fact Sheet for Patients: SugarRoll.be Fact Sheet for Healthcare Providers: https://www.woods-mathews.com/ This test is not yet approved or cleared by the [place] FDA and  has been authorized for detection and/or diagnosis of SARS-CoV-2 by FDA under an  Emergency Use Authorization (EUA). This EUA will remain  in effect (meaning this test can be used) for the duration of the COVID-19 declaration under Section 56 4(b)(1) of the Act, 21 U.S.C. section 360bbb-3(b)(1), unless the authorization is terminated or revoked sooner. Performed at [facility], [address]  [place], [place] [address]      Labs: CBC: Recent Labs  Lab 06/20/19 2054 06/21/19 0427  WBC 8.9 7.8  NEUTROABS 6.5  --   HGB 15.8 15.1  HCT 48.1 45.5  MCV 81.3 80.8  PLT 229 205   Basic Metabolic Panel: Recent Labs  Lab 06/20/19 2054 06/21/19 0427 06/22/19 0417  NA 138 137  --   K 3.7 3.6  --   CL 100 102  --   CO2 28 26  --   GLUCOSE 156* 139*  --   BUN 20 15  --   CREATININE 0.91 0.69 0.66  CALCIUM 9.3 8.4*  --    Liver Function Tests: No results for input(s): AST, ALT, ALKPHOS, BILITOT, PROT, ALBUMIN in the last 168 hours. No results for input(s): LIPASE, AMYLASE in the last 168 hours. No results for input(s): AMMONIA in the last 168 hours. Cardiac Enzymes: No results for input(s): CKTOTAL, CKMB, CKMBINDEX, TROPONINI in the last 168 hours. BNP (last 3 results) No results for input(s): BNP in the last 8760 hours. CBG: Recent Labs  Lab 06/21/19 0806 06/21/19 1147 06/21/19 1643 06/21/19 2113 06/22/19 0738  GLUCAP 131* 140* 146* 207* 221*    Time spent: 35 minutes  Signed:  Lynden Oxford  [facility]  06/22/2019 10:14 AM

## 2019-06-24 MED ORDER — ATORVASTATIN CALCIUM 20 MG PO TABS
20.00 | ORAL_TABLET | ORAL | Status: DC
Start: 2019-06-25 — End: 2019-06-24

## 2019-06-24 MED ORDER — DEXTROSE 50 % IV SOLN
12.50 | INTRAVENOUS | Status: DC
Start: ? — End: 2019-06-24

## 2019-06-24 MED ORDER — SODIUM CHLORIDE-SODIUM BICARB 1.57 G NA PACK
1.00 | PACK | NASAL | Status: DC
Start: 2019-06-25 — End: 2019-06-24

## 2019-06-24 MED ORDER — LACTATED RINGERS IV SOLN
75.00 | INTRAVENOUS | Status: DC
Start: ? — End: 2019-06-24

## 2019-06-24 MED ORDER — ACETAMINOPHEN 325 MG PO TABS
650.00 | ORAL_TABLET | ORAL | Status: DC
Start: ? — End: 2019-06-24

## 2019-06-24 MED ORDER — SALINE NASAL SPRAY 0.65 % NA SOLN
2.00 | NASAL | Status: DC
Start: 2019-06-25 — End: 2019-06-24

## 2019-06-24 MED ORDER — PANTOPRAZOLE SODIUM 40 MG PO TBEC
40.00 | DELAYED_RELEASE_TABLET | ORAL | Status: DC
Start: 2019-06-26 — End: 2019-06-24

## 2019-06-24 MED ORDER — INSULIN LISPRO 100 UNIT/ML ~~LOC~~ SOLN
0.00 | SUBCUTANEOUS | Status: DC
Start: 2019-06-25 — End: 2019-06-24

## 2019-06-24 MED ORDER — GENERIC EXTERNAL MEDICATION
Status: DC
Start: ? — End: 2019-06-24

## 2019-06-24 MED ORDER — DSS 100 MG PO CAPS
100.00 | ORAL_CAPSULE | ORAL | Status: DC
Start: 2019-06-25 — End: 2019-06-24

## 2019-06-24 MED ORDER — ENOXAPARIN SODIUM 40 MG/0.4ML ~~LOC~~ SOLN
40.00 | SUBCUTANEOUS | Status: DC
Start: 2019-06-25 — End: 2019-06-24

## 2019-06-24 MED ORDER — NASAL SPRAY 0.05 % NA SOLN
2.00 | NASAL | Status: DC
Start: 2019-06-24 — End: 2019-06-24

## 2019-06-24 MED ORDER — FLUTICASONE PROPIONATE 50 MCG/ACT NA SUSP
2.00 | NASAL | Status: DC
Start: 2019-06-25 — End: 2019-06-24

## 2019-06-24 MED ORDER — CARBOXYMETHYLCELLULOSE SODIUM 0.25 % OP SOLN
1.00 | OPHTHALMIC | Status: DC
Start: 2019-06-25 — End: 2019-06-24

## 2019-06-24 MED ORDER — SENNOSIDES 8.6 MG PO TABS
2.00 | ORAL_TABLET | ORAL | Status: DC
Start: 2019-06-25 — End: 2019-06-24

## 2019-06-24 MED ORDER — OXYCODONE HCL 5 MG PO TABS
5.00 | ORAL_TABLET | ORAL | Status: DC
Start: ? — End: 2019-06-24

## 2019-06-26 MED ORDER — DOXYCYCLINE HYCLATE 100 MG PO TABS
100.00 | ORAL_TABLET | ORAL | Status: DC
Start: 2019-06-25 — End: 2019-06-26

## 2019-06-26 MED ORDER — DEXTROSE 50 % IV SOLN
12.50 | INTRAVENOUS | Status: DC
Start: ? — End: 2019-06-26

## 2019-06-26 MED ORDER — INSULIN LISPRO 100 UNIT/ML ~~LOC~~ SOLN
4.00 | SUBCUTANEOUS | Status: DC
Start: 2019-06-25 — End: 2019-06-26

## 2019-06-26 MED ORDER — INSULIN NPH (HUMAN) (ISOPHANE) 100 UNIT/ML ~~LOC~~ SUSP
5.00 | SUBCUTANEOUS | Status: DC
Start: 2019-06-25 — End: 2019-06-26

## 2024-03-06 ENCOUNTER — Other Ambulatory Visit: Payer: Self-pay | Admitting: Orthopedic Surgery

## 2024-03-06 DIAGNOSIS — M4807 Spinal stenosis, lumbosacral region: Secondary | ICD-10-CM

## 2024-03-11 ENCOUNTER — Ambulatory Visit
Admission: RE | Admit: 2024-03-11 | Discharge: 2024-03-11 | Disposition: A | Discharge status: Home / Self Care | Source: Ambulatory Visit | Attending: Orthopedic Surgery | Admitting: Orthopedic Surgery

## 2024-03-11 DIAGNOSIS — M4807 Spinal stenosis, lumbosacral region: Secondary | ICD-10-CM | POA: Insufficient documentation
